# Patient Record
Sex: Female | Born: 1995 | Race: White | Hispanic: No | Marital: Single | Smoking: Current some day smoker
Health system: Southern US, Community
[De-identification: ages and names within clinical notes are randomized; demographics above are authoritative.]

## PROBLEM LIST (undated history)

## (undated) DIAGNOSIS — F32A Depression, unspecified: Secondary | ICD-10-CM

## (undated) DIAGNOSIS — F419 Anxiety disorder, unspecified: Secondary | ICD-10-CM

## (undated) DIAGNOSIS — F329 Major depressive disorder, single episode, unspecified: Secondary | ICD-10-CM

## (undated) DIAGNOSIS — J45909 Unspecified asthma, uncomplicated: Secondary | ICD-10-CM

## (undated) HISTORY — PX: NO PAST SURGERIES: SHX2092

---
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
## 2014-08-11 ENCOUNTER — Encounter (HOSPITAL_COMMUNITY): Payer: Self-pay | Admitting: Emergency Medicine

## 2014-08-11 ENCOUNTER — Other Ambulatory Visit (HOSPITAL_COMMUNITY): Payer: Self-pay

## 2014-08-11 ENCOUNTER — Emergency Department (HOSPITAL_COMMUNITY): Payer: No Typology Code available for payment source

## 2014-08-11 ENCOUNTER — Emergency Department (HOSPITAL_COMMUNITY)
Admission: EM | Admit: 2014-08-11 | Discharge: 2014-08-11 | Disposition: A | Discharge status: Home / Self Care | Payer: No Typology Code available for payment source | Attending: Emergency Medicine | Admitting: Emergency Medicine

## 2014-08-11 DIAGNOSIS — Z3202 Encounter for pregnancy test, result negative: Secondary | ICD-10-CM | POA: Insufficient documentation

## 2014-08-11 DIAGNOSIS — S99929A Unspecified injury of unspecified foot, initial encounter: Secondary | ICD-10-CM

## 2014-08-11 DIAGNOSIS — S99919A Unspecified injury of unspecified ankle, initial encounter: Secondary | ICD-10-CM

## 2014-08-11 DIAGNOSIS — Y9389 Activity, other specified: Secondary | ICD-10-CM | POA: Insufficient documentation

## 2014-08-11 DIAGNOSIS — S199XXA Unspecified injury of neck, initial encounter: Secondary | ICD-10-CM

## 2014-08-11 DIAGNOSIS — S79929A Unspecified injury of unspecified thigh, initial encounter: Secondary | ICD-10-CM | POA: Diagnosis not present

## 2014-08-11 DIAGNOSIS — IMO0002 Reserved for concepts with insufficient information to code with codable children: Secondary | ICD-10-CM | POA: Insufficient documentation

## 2014-08-11 DIAGNOSIS — S8990XA Unspecified injury of unspecified lower leg, initial encounter: Secondary | ICD-10-CM | POA: Insufficient documentation

## 2014-08-11 DIAGNOSIS — S0990XA Unspecified injury of head, initial encounter: Secondary | ICD-10-CM | POA: Diagnosis not present

## 2014-08-11 DIAGNOSIS — S0993XA Unspecified injury of face, initial encounter: Secondary | ICD-10-CM | POA: Insufficient documentation

## 2014-08-11 DIAGNOSIS — J45909 Unspecified asthma, uncomplicated: Secondary | ICD-10-CM | POA: Diagnosis not present

## 2014-08-11 DIAGNOSIS — S79919A Unspecified injury of unspecified hip, initial encounter: Secondary | ICD-10-CM | POA: Diagnosis not present

## 2014-08-11 DIAGNOSIS — Y92410 Unspecified street and highway as the place of occurrence of the external cause: Secondary | ICD-10-CM | POA: Insufficient documentation

## 2014-08-11 HISTORY — DX: Unspecified asthma, uncomplicated: J45.909

## 2014-08-11 LAB — CBC WITH DIFFERENTIAL/PLATELET
BASOS PCT: 1 % (ref 0–1)
Basophils Absolute: 0 10*3/uL (ref 0.0–0.1)
EOS ABS: 0.1 10*3/uL (ref 0.0–0.7)
Eosinophils Relative: 2 % (ref 0–5)
HEMATOCRIT: 41.9 % (ref 36.0–46.0)
HEMOGLOBIN: 14.2 g/dL (ref 12.0–15.0)
Lymphocytes Relative: 36 % (ref 12–46)
Lymphs Abs: 2.3 10*3/uL (ref 0.7–4.0)
MCH: 28.5 pg (ref 26.0–34.0)
MCHC: 33.9 g/dL (ref 30.0–36.0)
MCV: 84 fL (ref 78.0–100.0)
MONO ABS: 0.5 10*3/uL (ref 0.1–1.0)
MONOS PCT: 8 % (ref 3–12)
NEUTROS PCT: 53 % (ref 43–77)
Neutro Abs: 3.3 10*3/uL (ref 1.7–7.7)
Platelets: 208 10*3/uL (ref 150–400)
RBC: 4.99 MIL/uL (ref 3.87–5.11)
RDW: 13.8 % (ref 11.5–15.5)
WBC: 6.3 10*3/uL (ref 4.0–10.5)

## 2014-08-11 LAB — COMPREHENSIVE METABOLIC PANEL
ALBUMIN: 4 g/dL (ref 3.5–5.2)
ALT: 14 U/L (ref 0–35)
ANION GAP: 13 (ref 5–15)
AST: 21 U/L (ref 0–37)
Alkaline Phosphatase: 52 U/L (ref 39–117)
BUN: 7 mg/dL (ref 6–23)
CO2: 24 mEq/L (ref 19–32)
CREATININE: 0.72 mg/dL (ref 0.50–1.10)
Calcium: 9.2 mg/dL (ref 8.4–10.5)
Chloride: 105 mEq/L (ref 96–112)
GFR calc Af Amer: >90 mL/min (ref >90)
GFR calc non Af Amer: >90 mL/min (ref >90)
Glucose, Bld: 89 mg/dL (ref 70–99)
Potassium: 3.8 mEq/L (ref 3.7–5.3)
Sodium: 142 mEq/L (ref 137–147)
TOTAL PROTEIN: 7.3 g/dL (ref 6.0–8.3)
Total Bilirubin: 0.3 mg/dL (ref 0.3–1.2)

## 2014-08-11 LAB — ETHANOL

## 2014-08-11 LAB — RAPID URINE DRUG SCREEN, HOSP PERFORMED
Amphetamines: NOT DETECTED
BENZODIAZEPINES: NOT DETECTED
Barbiturates: NOT DETECTED
COCAINE: NOT DETECTED
OPIATES: NOT DETECTED
Tetrahydrocannabinol: POSITIVE — AB

## 2014-08-11 LAB — POC URINE PREG, ED: PREG TEST UR: NEGATIVE

## 2014-08-11 MED ORDER — SODIUM CHLORIDE 0.9 % IV BOLUS (SEPSIS)
1000.0000 mL | Freq: Once | INTRAVENOUS | Status: AC
  Administered 2014-08-11: 1000 mL via INTRAVENOUS

## 2014-08-11 MED ORDER — HYDROCODONE-ACETAMINOPHEN 5-325 MG PO TABS: 1.0000 | ORAL_TABLET | Freq: Four times a day (QID) | ORAL | Status: DC | PRN

## 2014-08-11 MED ORDER — CYCLOBENZAPRINE HCL 10 MG PO TABS: 10.0000 mg | ORAL_TABLET | Freq: Two times a day (BID) | ORAL | Status: DC | PRN

## 2014-08-11 MED ORDER — HYDROCODONE-ACETAMINOPHEN 5-325 MG PO TABS
2.0000 | ORAL_TABLET | Freq: Once | ORAL | Status: AC
  Administered 2014-08-11: 2 via ORAL
  Filled 2014-08-11: qty 2

## 2014-08-11 MED ORDER — IBUPROFEN 800 MG PO TABS: 800.0000 mg | ORAL_TABLET | Freq: Three times a day (TID) | ORAL | Status: DC

## 2014-08-11 MED ORDER — IOHEXOL 300 MG/ML  SOLN
80.0000 mL | Freq: Once | INTRAMUSCULAR | Status: AC | PRN
  Administered 2014-08-11: 80 mL via INTRAVENOUS

## 2014-08-11 NOTE — ED Provider Notes (Signed)
Medical screening examination/treatment/procedure(s) were performed by non-physician practitioner and as supervising physician I was immediately available for consultation/collaboration.   EKG Interpretation None        Gilda Crease, MD 08/11/14 [phone]

## 2014-08-11 NOTE — ED Notes (Signed)
Pt ambulated in hallway with some pain to R leg.
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
## 2014-08-11 NOTE — ED Notes (Signed)
Marissa,PA at the bedside.  

## 2014-08-11 NOTE — ED Notes (Signed)
State trooper at bedside
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
## 2014-08-12 NOTE — ED Provider Notes (Signed)
Medical screening examination/treatment/procedure(s) were performed by non-physician practitioner and as supervising physician I was immediately available for consultation/collaboration.   EKG Interpretation None        Vanetta Mulders, MD 08/12/14 1125

## 2016-09-27 ENCOUNTER — Emergency Department (HOSPITAL_BASED_OUTPATIENT_CLINIC_OR_DEPARTMENT_OTHER)
Admission: EM | Admit: 2016-09-27 | Discharge: 2016-09-27 | Disposition: A | Discharge status: Home / Self Care | Payer: Medicaid Other | Attending: Emergency Medicine | Admitting: Emergency Medicine

## 2016-09-27 ENCOUNTER — Encounter (HOSPITAL_BASED_OUTPATIENT_CLINIC_OR_DEPARTMENT_OTHER): Payer: Self-pay

## 2016-09-27 DIAGNOSIS — N898 Other specified noninflammatory disorders of vagina: Secondary | ICD-10-CM

## 2016-09-27 DIAGNOSIS — J45909 Unspecified asthma, uncomplicated: Secondary | ICD-10-CM | POA: Insufficient documentation

## 2016-09-27 LAB — WET PREP, GENITAL
Clue Cells Wet Prep HPF POC: NONE SEEN
Sperm: NONE SEEN
Trich, Wet Prep: NONE SEEN
Yeast Wet Prep HPF POC: NONE SEEN

## 2016-09-27 LAB — URINALYSIS, ROUTINE W REFLEX MICROSCOPIC
Bilirubin Urine: NEGATIVE
GLUCOSE, UA: NEGATIVE mg/dL
Hgb urine dipstick: NEGATIVE
Ketones, ur: NEGATIVE mg/dL
Nitrite: NEGATIVE
PROTEIN: NEGATIVE mg/dL
Specific Gravity, Urine: 1.017 (ref 1.005–1.030)
pH: 6 (ref 5.0–8.0)

## 2016-09-27 LAB — URINE MICROSCOPIC-ADD ON

## 2016-09-27 LAB — PREGNANCY, URINE: PREG TEST UR: NEGATIVE

## 2016-09-27 MED ORDER — AZITHROMYCIN 250 MG PO TABS
1000.0000 mg | ORAL_TABLET | Freq: Once | ORAL | Status: AC
  Administered 2016-09-27: 1000 mg via ORAL
  Filled 2016-09-27: qty 4

## 2016-09-27 MED ORDER — DOXYCYCLINE HYCLATE 100 MG PO CAPS: 100.0000 mg | ORAL_CAPSULE | Freq: Two times a day (BID) | ORAL | 0 refills | Status: DC

## 2016-09-27 MED ORDER — CEFTRIAXONE SODIUM 250 MG IJ SOLR
250.0000 mg | Freq: Once | INTRAMUSCULAR | Status: AC
  Administered 2016-09-27: 250 mg via INTRAMUSCULAR
  Filled 2016-09-27: qty 250

## 2016-09-27 MED ORDER — LIDOCAINE HCL (PF) 1 % IJ SOLN
INTRAMUSCULAR | Status: AC
Start: 2016-09-27 — End: 2016-09-27
  Administered 2016-09-27: 5 mL
  Filled 2016-09-27: qty 5

## 2016-09-27 NOTE — Discharge Instructions (Signed)
Take the prescribed medication as directed. Follow-up with your OB-GYN if you continue having issues. Return to the ED for new or worsening symptoms.

## 2016-09-27 NOTE — ED Provider Notes (Signed)
[facility] Note   CSN: [id number] Arrival date & time: 09/27/16  2105  By signing my name below, I, Linna Darner, attest that this documentation has been prepared under the direction and in the presence of Sharilyn Sites, PA-C. Electronically Signed: Linna Darner, Scribe. 09/27/2016. 10:11 PM.  History   Chief Complaint Chief Complaint  Patient presents with  . Dysuria    The history is provided by the patient. No language interpreter was used.     HPI Comments: Paige Sims is a 20 y.o. female who presents to the Emergency Department complaining of sudden onset, intermittent, abdominal pain for the last two weeks.  She states her pain presents randomly throughout her abdomen, varying from day to day. Pt endorses associated bilateral flank pain, brown vaginal discharge, nausea, and pressure with urination. She states she has experienced dysuria but this has resolved. Pt reports she has been eating normally since onset. She notes she had an IUD (Mirena) placed about 3 years ago. She reports her periods are usually irregular. Pt denies h/o abdominal surgery. She denies recent new sexual partners in the past 3 months. She further denies vomiting, frequency, urgency, or any other associated symptoms.  Past Medical History:  Diagnosis Date  . Asthma     There are no active problems to display for this patient.   History reviewed. No pertinent surgical history.  OB History    No data available       Home Medications    Prior to Admission medications   Medication Sig Start Date End Date Taking? Authorizing Provider  adapalene (DIFFERIN) 0.1 % cream Apply 1 application topically daily. As directed 07/29/14   Historical Provider, MD  cyclobenzaprine (FLEXERIL) 10 MG tablet Take 1 tablet (10 mg total) by mouth 2 (two) times daily as needed for muscle spasms. 08/11/14   Roxy Horseman, PA-C  HYDROcodone-acetaminophen (NORCO/VICODIN) 5-325 MG per tablet Take  1-2 tablets by mouth every 6 (six) hours as needed. 08/11/14   Roxy Horseman, PA-C  ibuprofen (ADVIL,MOTRIN) 800 MG tablet Take 1 tablet (800 mg total) by mouth 3 (three) times daily. 08/11/14   Roxy Horseman, PA-C  triamcinolone ointment (KENALOG) 0.1 % Apply 1 application topically daily. As directed 07/29/14   Historical Provider, MD    Family History No family history on file.  Social History Social History  Substance Use Topics  . Smoking status: Never Smoker  . Smokeless tobacco: Not on file  . Alcohol use Yes     Comment: occassional     Allergies   Review of patient's allergies indicates no known allergies.   Review of Systems Review of Systems  Gastrointestinal: Positive for abdominal pain and nausea. Negative for vomiting.  Genitourinary: Positive for dysuria (resolved), flank pain (bilateral) and vaginal discharge. Negative for frequency and urgency.  All other systems reviewed and are negative.   Physical Exam Updated Vital Signs BP 118/82 (BP Location: Right Arm)   Pulse 74   Temp 98.3 F (36.8 C) (Oral)   Resp 18   Ht 5\' 4"  (1.626 m)   Wt 115 lb (52.2 kg)   LMP 08/28/2016 (Approximate) Comment: IUD  SpO2 99%   BMI 19.74 kg/m   Physical Exam  Constitutional: She is oriented to person, place, and time. She appears well-developed and well-nourished.  HENT:  Head: Normocephalic and atraumatic.  Mouth/Throat: Oropharynx is clear and moist.  Eyes: Conjunctivae and EOM are normal. Pupils are equal, round, and reactive to light.  Neck: Normal  range of motion.  Cardiovascular: Normal rate, regular rhythm and normal heart sounds.   Pulmonary/Chest: Effort normal and breath sounds normal.  Abdominal: Soft. Bowel sounds are normal. There is no tenderness. There is no rigidity and no guarding.  Abdomen soft, nontender, no peritonitis, no CVA tenderness  Genitourinary:  Genitourinary Comments: Normal female external genitalia without visible lesions or rash,  moderate amount of yellow/clear mucous noted surrounding cervix and in vaginal vault; IUD strings visible; no bleeding noted; no adnexal or CMT; no masses palpated  Musculoskeletal: Normal range of motion.  Neurological: She is alert and oriented to person, place, and time.  Skin: Skin is warm and dry.  Psychiatric: She has a normal mood and affect.  Nursing note and vitals reviewed.   ED Treatments / Results  Labs (all labs ordered are listed, but only abnormal results are displayed) Labs Reviewed  WET PREP, GENITAL - Abnormal; Notable for the following:       Result Value   WBC, Wet Prep HPF POC MANY (*)    All other components within normal limits  URINALYSIS, ROUTINE W REFLEX MICROSCOPIC (NOT AT [facility]) - Abnormal; Notable for the following:    Leukocytes, UA MODERATE (*)    All other components within normal limits  URINE MICROSCOPIC-ADD ON - Abnormal; Notable for the following:    Squamous Epithelial / LPF 0-5 (*)    Bacteria, UA FEW (*)    All other components within normal limits  PREGNANCY, URINE  GC/CHLAMYDIA PROBE AMP ([place]) NOT AT [facility]    EKG  EKG Interpretation None       Radiology No results found.  Procedures Procedures (including critical care time)  DIAGNOSTIC STUDIES: Oxygen Saturation is 99% on RA, normal by my interpretation.    COORDINATION OF CARE: 10:17 PM Discussed treatment plan with pt at bedside and pt agreed to plan.  Medications Ordered in ED Medications - No data to display   Initial Impression / Assessment and Plan / ED Course  I have reviewed the triage vital signs and the nursing notes.  Pertinent labs & imaging results that were available during my care of the patient were reviewed by me and considered in my medical decision making (see chart for details).  Clinical Course   20 year old female here with abdominal pain. She also reports some vaginal discharge and pressure when urinating.  She is afebrile and nontoxic.   Abdomen is soft and benign. No CVA tenderness. Urine pregnancy test is negative. UA with leukocytes noted, no blood.  Pelvic exam with some yellow/clear mucous noted surrounding the cervix as well as vaginal vault. Her IUD strings are visible. No bleeding. No adnexal or cervical motion tenderness. Many WBCs noted on wet prep. Gc/chl pending.  Given her symptoms, concern for potential STD.  Given her IUD, patient is high risk for developing PID.  Will treat with rocephin and azithromycin here.  D/c home with doxycycline.  Encouraged to follow-up with her GYN.  Discussed plan with patient, she acknowledged understanding and agreed with plan of care.  Return precautions given for new or worsening symptoms.  I personally performed the services described in this documentation, which was scribed in my presence. The recorded information has been reviewed and is accurate.  Final Clinical Impressions(s) / ED Diagnoses   Final diagnoses:  Vaginal discharge    New Prescriptions New Prescriptions   DOXYCYCLINE (VIBRAMYCIN) 100 MG CAPSULE    Take 1 capsule (100 mg total) by mouth 2 (two) times  daily.     Garlon Hatchet, PA-C 09/27/16 2320    Lavera Guise, MD 09/28/16 [phone]

## 2016-09-27 NOTE — ED Triage Notes (Signed)
Pt c/o lower abdominal pain with urinary pressure and lower back pain, no fevers, c/o dark vaginal discharge, hematuria and nausea, symptoms worsening over the last week.

## 2016-09-29 LAB — GC/CHLAMYDIA PROBE AMP (~~LOC~~) NOT AT ARMC
Chlamydia: POSITIVE — AB
Neisseria Gonorrhea: NEGATIVE

## 2016-09-30 ENCOUNTER — Telehealth (HOSPITAL_COMMUNITY): Payer: Self-pay

## 2016-09-30 NOTE — Telephone Encounter (Signed)
[facility] received [facility].  (+) Chlamydia Pt treated with Zithromax and Rocephin.  [facility] form completed and faxed.      09/30/2016 @ 0905  VM not set up.  [facility]

## 2016-10-26 ENCOUNTER — Telehealth (HOSPITAL_BASED_OUTPATIENT_CLINIC_OR_DEPARTMENT_OTHER): Payer: Self-pay | Admitting: Emergency Medicine

## 2016-10-26 NOTE — Telephone Encounter (Signed)
No response to letter, LOST TO FOLLOWUP 

## 2017-03-08 ENCOUNTER — Emergency Department (HOSPITAL_BASED_OUTPATIENT_CLINIC_OR_DEPARTMENT_OTHER): Payer: Self-pay

## 2017-03-08 ENCOUNTER — Observation Stay (HOSPITAL_BASED_OUTPATIENT_CLINIC_OR_DEPARTMENT_OTHER)
Admission: EM | Admit: 2017-03-08 | Discharge: 2017-03-09 | Disposition: A | Discharge status: Home / Self Care | Payer: Self-pay | Attending: Internal Medicine | Admitting: Internal Medicine

## 2017-03-08 ENCOUNTER — Encounter (HOSPITAL_BASED_OUTPATIENT_CLINIC_OR_DEPARTMENT_OTHER): Payer: Self-pay

## 2017-03-08 DIAGNOSIS — Z792 Long term (current) use of antibiotics: Secondary | ICD-10-CM | POA: Insufficient documentation

## 2017-03-08 DIAGNOSIS — J029 Acute pharyngitis, unspecified: Secondary | ICD-10-CM | POA: Insufficient documentation

## 2017-03-08 DIAGNOSIS — J45909 Unspecified asthma, uncomplicated: Secondary | ICD-10-CM | POA: Insufficient documentation

## 2017-03-08 DIAGNOSIS — J982 Interstitial emphysema: Principal | ICD-10-CM | POA: Diagnosis present

## 2017-03-08 HISTORY — DX: Depression, unspecified: F32.A

## 2017-03-08 HISTORY — DX: Major depressive disorder, single episode, unspecified: F32.9

## 2017-03-08 HISTORY — DX: Anxiety disorder, unspecified: F41.9

## 2017-03-08 LAB — CBC WITH DIFFERENTIAL/PLATELET
Basophils Absolute: 0 10*3/uL (ref 0.0–0.1)
Basophils Relative: 0 %
Eosinophils Absolute: 0.2 10*3/uL (ref 0.0–0.7)
Eosinophils Relative: 3 %
HCT: 44.4 % (ref 36.0–46.0)
HEMOGLOBIN: 15.5 g/dL — AB (ref 12.0–15.0)
LYMPHS ABS: 2 10*3/uL (ref 0.7–4.0)
Lymphocytes Relative: 38 %
MCH: 31.3 pg (ref 26.0–34.0)
MCHC: 34.9 g/dL (ref 30.0–36.0)
MCV: 89.7 fL (ref 78.0–100.0)
Monocytes Absolute: 0.4 10*3/uL (ref 0.1–1.0)
Monocytes Relative: 8 %
NEUTROS PCT: 51 %
Neutro Abs: 2.7 10*3/uL (ref 1.7–7.7)
Platelets: 193 10*3/uL (ref 150–400)
RBC: 4.95 MIL/uL (ref 3.87–5.11)
RDW: 12.3 % (ref 11.5–15.5)
WBC: 5.3 10*3/uL (ref 4.0–10.5)

## 2017-03-08 LAB — COMPREHENSIVE METABOLIC PANEL
ALK PHOS: 40 U/L (ref 38–126)
ALT: 14 U/L (ref 14–54)
AST: 21 U/L (ref 15–41)
Albumin: 4.8 g/dL (ref 3.5–5.0)
Anion gap: 9 (ref 5–15)
BILIRUBIN TOTAL: 0.4 mg/dL (ref 0.3–1.2)
BUN: 12 mg/dL (ref 6–20)
CHLORIDE: 99 mmol/L — AB (ref 101–111)
CO2: 31 mmol/L (ref 22–32)
Calcium: 10.1 mg/dL (ref 8.9–10.3)
Creatinine, Ser: 0.83 mg/dL (ref 0.44–1.00)
GFR calc non Af Amer: >60 mL/min (ref >60)
Glucose, Bld: 98 mg/dL (ref 65–99)
Potassium: 3.5 mmol/L (ref 3.5–5.1)
Sodium: 139 mmol/L (ref 135–145)
Total Protein: 8.2 g/dL — ABNORMAL HIGH (ref 6.5–8.1)

## 2017-03-08 LAB — PREGNANCY, URINE: Preg Test, Ur: NEGATIVE

## 2017-03-08 MED ORDER — SODIUM CHLORIDE 0.9 % IV BOLUS (SEPSIS)
1000.0000 mL | Freq: Once | INTRAVENOUS | Status: AC
  Administered 2017-03-08: 1000 mL via INTRAVENOUS

## 2017-03-08 MED ORDER — SODIUM CHLORIDE 0.9 % IV SOLN
INTRAVENOUS | Status: DC
  Administered 2017-03-08: via INTRAVENOUS

## 2017-03-08 MED ORDER — IOPAMIDOL (ISOVUE-300) INJECTION 61%
80.0000 mL | Freq: Once | INTRAVENOUS | Status: AC | PRN
  Administered 2017-03-08: 80 mL via INTRAVENOUS

## 2017-03-08 MED ORDER — ONDANSETRON HCL 4 MG/2ML IJ SOLN
4.0000 mg | Freq: Once | INTRAMUSCULAR | Status: AC
  Administered 2017-03-09: 4 mg via INTRAVENOUS
  Filled 2017-03-08: qty 2

## 2017-03-08 MED ORDER — MORPHINE SULFATE (PF) 4 MG/ML IV SOLN
4.0000 mg | Freq: Once | INTRAVENOUS | Status: AC
  Administered 2017-03-09: 4 mg via INTRAVENOUS
  Filled 2017-03-08: qty 1

## 2017-03-08 NOTE — Consult Note (Signed)
I have discussed the patient's clinical presentation over the telephone at length with Dr. Elesa MassedWard and personally reviewed the patient's CT scan.  Her clinical picture is most consistent with primary spontaneous pneumomediastinum.  There are no clinical features nor diagnostic findings to suggest that the patient might have an esophageal perforation.  Under the circumstances I would typically recommend overnight observation and keeping the patient NPO with plans for repeat PA and lateral CXR and gastrograffin upper GI contrast study tomorrow.  If these are negative a liquid diet could probably be started at that time and the patient discharged home if she were otherwise feeling better.  All questions answered.  Purcell Nailslarence H Owen, MD 03/08/2017 11:57 PM

## 2017-03-08 NOTE — ED Triage Notes (Signed)
Pt c/o upper chest and neck "pressure" and "swollen feeling," states it is hard for her to take a deep breath.  Pt denies fevers, no oral swelling in triage

## 2017-03-08 NOTE — ED Notes (Signed)
Contacted CT tech and made them aware this pt needs to be scanned next.

## 2017-03-08 NOTE — ED Provider Notes (Signed)
By signing my name below, I, Valentino Saxon, attest that this documentation has been prepared under the direction and in the presence of Katelynn Heidler N Nilay Mangrum, DO. Electronically Signed: Valentino Saxon, ED Scribe. 03/08/17. 11:16 PM.   TIME SEEN: 11:03 PM  CHIEF COMPLAINT: Sore throat  HPI: HPI Comments: Paige Sims is a 21 y.o. female with PMHx of asthma who presents to the Emergency Department complaining of 8/10, gradual onset, constant, sore throat that occurred yesterday. She reports associated upper chest pain with a pressure-like sensation accompanied by a stabbing feeling. Pt states it feels like "she has to burp, but I can't get it out", similar to a gas-bubble feeling. Pt notes having right-sided neck pain that radiated to her lower back yesterday, but states it subsided this morning. Per pt, she notes having mild swelling to her neck "to the point I couldn't feel anything because it was so swollen." She states her sore throat is exacerbated with eating, drinking fluids, and deep breathing. She reports hearing a "crackling" noise when touching her neck and with swallowing fluids. Pt also reports associated appetite change due to difficulty swallowing any foods or fluids due to pain. No modifying factors noted. Pt denies cough, fever and abdominal pain. She denies having a PCP at this time. No recent vomiting. No recent endoscopy, intubation or other procedures. History of pneumomediastinum. No history of trauma. She does smoke cigarettes and has been using a vape.  ROS: See HPI Constitutional: sore throat, no fever  Eyes: no drainage  ENT: no runny nose   Cardiovascular:  chest pain  Resp: no SOB  GI: no vomiting GU: no dysuria Integumentary: no rash  Allergy: no hives  Musculoskeletal: no leg swelling  Neurological: no slurred speech ROS otherwise negative  PAST MEDICAL HISTORY/PAST SURGICAL HISTORY:  Past Medical History:  Diagnosis Date  . Asthma     MEDICATIONS:  Prior  to Admission medications   Medication Sig Start Date End Date Taking? Authorizing Provider  adapalene (DIFFERIN) 0.1 % cream Apply 1 application topically daily. As directed 07/29/14   Historical Provider, MD  cyclobenzaprine (FLEXERIL) 10 MG tablet Take 1 tablet (10 mg total) by mouth 2 (two) times daily as needed for muscle spasms. 08/11/14   Roxy Horseman, PA-C  doxycycline (VIBRAMYCIN) 100 MG capsule Take 1 capsule (100 mg total) by mouth 2 (two) times daily. 09/27/16   Garlon Hatchet, PA-C  HYDROcodone-acetaminophen (NORCO/VICODIN) 5-325 MG per tablet Take 1-2 tablets by mouth every 6 (six) hours as needed. 08/11/14   Roxy Horseman, PA-C  ibuprofen (ADVIL,MOTRIN) 800 MG tablet Take 1 tablet (800 mg total) by mouth 3 (three) times daily. 08/11/14   Roxy Horseman, PA-C  triamcinolone ointment (KENALOG) 0.1 % Apply 1 application topically daily. As directed 07/29/14   Historical Provider, MD    ALLERGIES:  No Known Allergies  SOCIAL HISTORY:  Social History  Substance Use Topics  . Smoking status: Never Smoker  . Smokeless tobacco: Not on file  . Alcohol use Yes     Comment: occassional    FAMILY HISTORY: No family history on file.  EXAM: BP 113/65 (BP Location: Right Arm)   Pulse 83   Temp 98.3 F (36.8 C) (Oral)   Resp (!) 23   Ht 5\' 3"  (1.6 m)   Wt 115 lb (52.2 kg)   LMP 03/06/2017   SpO2 100%   BMI 20.37 kg/m  CONSTITUTIONAL: Alert and oriented and responds appropriately to questions. Well-appearing; well-nourished HEAD: Normocephalic EYES: Conjunctivae clear,  pupils appear equal, EOMI ENT: normal nose; moist mucous membranes; No pharyngeal erythema or petechiae, no tonsillar hypertrophy or exudate, no uvular deviation, no unilateral swelling, no trismus or drooling, no muffled voice, normal phonation, no stridor, no dental caries present, no drainable dental abscess noted, no Ludwig's angina, tongue sits flat in the bottom of the mouth, no angioedema, no facial  erythema or warmth, no facial swelling; no pain with movement of the neck. NECK: Supple, no meningismus, no nuchal rigidity, no LAD; no significant swelling, trachea is midline, patient does have some crepitus with palpation of the lateral right neck CARD: RRR; S1 and S2 appreciated; no murmurs, no clicks, no rubs, no gallops CHEST:  Chest wall is nontender to palpation.  No crepitus, ecchymosis, erythema, warmth, rash or other lesions present.   RESP: Normal chest excursion without splinting or tachypnea; breath sounds clear and equal bilaterally; no wheezes, no rhonchi, no rales, no hypoxia or respiratory distress, speaking full sentences ABD/GI: Normal bowel sounds; non-distended; soft, non-tender, no rebound, no guarding, no peritoneal signs, no hepatosplenomegaly BACK:  The back appears normal and is non-tender to palpation, there is no CVA tenderness EXT: Normal ROM in all joints; non-tender to palpation; no edema; normal capillary refill; no cyanosis, no calf tenderness or swelling    SKIN: Normal color for age and race; warm; no rash NEURO: Moves all extremities equally PSYCH: The patient's mood and manner are appropriate. Grooming and personal hygiene are appropriate.  MEDICAL DECISION MAKING: Patient here with pneumomediastinum. X-ray obtained in triage showed pneumomediastinum and further evaluation with CT scan was recommended. Labs obtained in triage are unremarkable and urine pregnancy test is negative. CT scan shows pneumomediastinum predominantly around the esophagus without extravasation of oral contrast. Esophageal perforation cannot be excluded. Lungs appear normal. We'll discuss with cardiothoracic surgery on-call but suspect this is spontaneous pneumomediastinum, possibly from smoking.  ED PROGRESS: Discussed with Dr. Cornelius Moraswen with cardiothoracic surgery. We appreciate his help. He has reviewed patient's imaging and agrees that this is likely not esophageal perforation given no oral  contrast or fluid seen in the soft tissues. She is able to swallow here without difficulty. She is hemodynamically stable but is mildly tachypneic and tachycardic intermittently but this could be from pain and anxiety. No significant swelling of her neck on exam and no compromise of her airway. Dr. Cornelius Moraswen recommended medical admission for observation, keeping patient nothing by mouth and obtaining a PA and lateral chest x-ray in the morning. Patient is comfortable with this plan. We'll discuss with hospitalist.    12:26 AM Discussed patient's case with hospitalist, Dr. Katrinka BlazingSmith.  I have recommended admission and patient (and family if present) agree with this plan. Admitting physician will place admission orders.   I reviewed all nursing notes, vitals, pertinent previous records, EKGs, lab and urine results, imaging (as available).   I personally performed the services described in this documentation, which was scribed in my presence. The recorded information has been reviewed and is accurate.     Layla MawKristen N Constantino Starace, DO 03/09/17 [id number]

## 2017-03-08 NOTE — ED Notes (Signed)
Patient transported to CT 

## 2017-03-09 ENCOUNTER — Encounter (HOSPITAL_COMMUNITY): Payer: Self-pay | Admitting: Internal Medicine

## 2017-03-09 ENCOUNTER — Observation Stay (HOSPITAL_COMMUNITY): Payer: Self-pay

## 2017-03-09 DIAGNOSIS — J982 Interstitial emphysema: Secondary | ICD-10-CM | POA: Diagnosis present

## 2017-03-09 LAB — CBC
HCT: 37.5 % (ref 36.0–46.0)
Hemoglobin: 12.5 g/dL (ref 12.0–15.0)
MCH: 30.3 pg (ref 26.0–34.0)
MCHC: 33.3 g/dL (ref 30.0–36.0)
MCV: 90.8 fL (ref 78.0–100.0)
PLATELETS: 147 10*3/uL — AB (ref 150–400)
RBC: 4.13 MIL/uL (ref 3.87–5.11)
RDW: 12.3 % (ref 11.5–15.5)
WBC: 5.4 10*3/uL (ref 4.0–10.5)

## 2017-03-09 LAB — BASIC METABOLIC PANEL
ANION GAP: 9 (ref 5–15)
BUN: 10 mg/dL (ref 6–20)
CALCIUM: 8.8 mg/dL — AB (ref 8.9–10.3)
CO2: 27 mmol/L (ref 22–32)
CREATININE: 0.87 mg/dL (ref 0.44–1.00)
Chloride: 106 mmol/L (ref 101–111)
GLUCOSE: 89 mg/dL (ref 65–99)
Potassium: 3.8 mmol/L (ref 3.5–5.1)
Sodium: 142 mmol/L (ref 135–145)

## 2017-03-09 LAB — HIV ANTIBODY (ROUTINE TESTING W REFLEX): HIV SCREEN 4TH GENERATION: NONREACTIVE

## 2017-03-09 MED ORDER — ONDANSETRON HCL 4 MG/2ML IJ SOLN
4.0000 mg | Freq: Four times a day (QID) | INTRAMUSCULAR | Status: DC | PRN
  Administered 2017-03-09: 4 mg via INTRAVENOUS
  Filled 2017-03-09: qty 2

## 2017-03-09 MED ORDER — IOPAMIDOL (ISOVUE-300) INJECTION 61%
300.0000 mL | Freq: Once | INTRAVENOUS | Status: AC | PRN
  Administered 2017-03-09: 25 mL via ORAL

## 2017-03-09 MED ORDER — ACETAMINOPHEN 325 MG PO TABS
650.0000 mg | ORAL_TABLET | Freq: Four times a day (QID) | ORAL | Status: DC | PRN
  Filled 2017-03-09: qty 2

## 2017-03-09 MED ORDER — ONDANSETRON HCL 4 MG PO TABS: 4.0000 mg | ORAL_TABLET | Freq: Four times a day (QID) | ORAL | Status: DC | PRN

## 2017-03-09 MED ORDER — IOPAMIDOL (ISOVUE-300) INJECTION 61%
INTRAVENOUS | Status: AC
  Administered 2017-03-09: 25 mL via ORAL
  Filled 2017-03-09: qty 300

## 2017-03-09 MED ORDER — MORPHINE SULFATE (PF) 2 MG/ML IV SOLN
2.0000 mg | INTRAVENOUS | Status: DC | PRN
  Administered 2017-03-09: 2 mg via INTRAVENOUS
  Filled 2017-03-09: qty 1

## 2017-03-09 MED ORDER — ALBUTEROL SULFATE (2.5 MG/3ML) 0.083% IN NEBU: 2.5000 mg | INHALATION_SOLUTION | RESPIRATORY_TRACT | Status: DC | PRN

## 2017-03-09 MED ORDER — ACETAMINOPHEN 650 MG RE SUPP: 650.0000 mg | Freq: Four times a day (QID) | RECTAL | Status: DC | PRN

## 2017-03-09 MED ORDER — ENOXAPARIN SODIUM 30 MG/0.3ML ~~LOC~~ SOLN: 30.0000 mg | SUBCUTANEOUS | Status: DC

## 2017-03-09 NOTE — H&P (Signed)
History and Physical    Paige Sims ZOX:[id number] DOB: November 18, 1996 DOA: 03/08/2017  Referring MD/NP/PA: Dr. Elesa Massed PCP: No PCP Per Patient  Patient coming from: [facility] transfer  Chief Complaint: Sore throat  HPI: Paige Sims is a 21 y.o. female without significant past medical history, who presents with a 2 day history of sore throat. Patient reports constant throat discomfort that is sharp in nature. Rates pain as 8 out of 10 on a pain scale. Initially patient thought symptoms were secondary to indigestion, but when they persisted became more concerned. Tried resting without relief in symptoms. Associated symptoms included neck swelling, chest pressure, difficulty swallowing, decreased appetite, and right-sided neck crepitus. Symptoms seem to worsen with any deep breathing, eating, drinking fluids, or coughing.  Denies any recent trauma, fever, chills, headache, nausea, vomiting, diarrhea, cough, or recent sick contacts.   ED Course: Upon admission to the emergency department patient was found to have acute pneumomediastinum on CT scan.  vital signs were otherwise noted to be stable. Dr. Cornelius Moras of CT surgery was consulted, but recommended observation with repeat AP and lateral Chest x-ray along with upper GI series with Gastrografin in a.m.  Review of Systems: As per HPI otherwise 10 point review of systems negative.   Past Medical History:  Diagnosis Date  . Asthma     History reviewed. No pertinent surgical history.   reports that she has been smoking E-cigarettes.  She does not have any smokeless tobacco history on file. She reports that she drinks alcohol. She reports that she does not use drugs.  No Known Allergies  No family history on file.  Prior to Admission medications   Medication Sig Start Date End Date Taking? Authorizing Provider  adapalene (DIFFERIN) 0.1 % cream Apply 1 application topically daily. As directed 07/29/14   Historical Provider, MD  cyclobenzaprine (FLEXERIL)  10 MG tablet Take 1 tablet (10 mg total) by mouth 2 (two) times daily as needed for muscle spasms. 08/11/14   Roxy Horseman, PA-C  doxycycline (VIBRAMYCIN) 100 MG capsule Take 1 capsule (100 mg total) by mouth 2 (two) times daily. 09/27/16   Garlon Hatchet, PA-C  HYDROcodone-acetaminophen (NORCO/VICODIN) 5-325 MG per tablet Take 1-2 tablets by mouth every 6 (six) hours as needed. 08/11/14   Roxy Horseman, PA-C  ibuprofen (ADVIL,MOTRIN) 800 MG tablet Take 1 tablet (800 mg total) by mouth 3 (three) times daily. 08/11/14   Roxy Horseman, PA-C  triamcinolone ointment (KENALOG) 0.1 % Apply 1 application topically daily. As directed 07/29/14   Historical Provider, MD    Physical Exam:    Constitutional: Young female in NAD, calm, comfortable Vitals:   03/09/17 0030 03/09/17 0100 03/09/17 0131 03/09/17 0228  BP: 115/80 (!) 107/54 98/61 110/64  Pulse: 92 77 76 68  Resp: (!) 27 19 11 12   Temp:   98.2 F (36.8 C) 98.4 F (36.9 C)  TempSrc:   Oral Oral  SpO2: 99% 98% 98% 99%  Weight:    48 kg (105 lb 12.8 oz)  Height:    5\' 3"  (1.6 m)   Eyes: PERRL, lids and conjunctivae normal ENMT: Mucous membranes are dry. Posterior pharynx clear of any exudate or lesions. Normal dentition.  Neck: normal, supple, no masses, no thyromegaly. Tenderness to palpation on the right side of neck and no significant crepitus appreciated at this time.  Respiratory: clear to auscultation bilaterally, no wheezing, no crackles. Normal respiratory effort. No accessory muscle use. Able to talk in full complete sentences. Cardiovascular: Regular rate  and rhythm, no murmurs / rubs / gallops. No extremity edema. 2+ pedal pulses. No carotid bruits.  Abdomen: no tenderness, no masses palpated. No hepatosplenomegaly. Bowel sounds positive.  Musculoskeletal: no clubbing / cyanosis. No joint deformity upper and lower extremities. Good ROM, no contractures. Normal muscle tone.  Skin: no rashes, lesions, ulcers. No  induration Neurologic: CN 2-12 grossly intact. Sensation intact, DTR normal. Strength 5/5 in all 4.  Psychiatric: Normal judgment and insight. Alert and oriented x 3. Normal mood.     Labs on Admission: I have personally reviewed following labs and imaging studies  CBC:  Recent Labs Lab 03/08/17 2134  WBC 5.3  NEUTROABS 2.7  HGB 15.5*  HCT 44.4  MCV 89.7  PLT 193   Basic Metabolic Panel:  Recent Labs Lab 03/08/17 2134  NA 139  K 3.5  CL 99*  CO2 31  GLUCOSE 98  BUN 12  CREATININE 0.83  CALCIUM 10.1   GFR: Estimated Creatinine Clearance: 80.6 mL/min (by C-G formula based on SCr of 0.83 mg/dL). Liver Function Tests:  Recent Labs Lab 03/08/17 2134  AST 21  ALT 14  ALKPHOS 40  BILITOT 0.4  PROT 8.2*  ALBUMIN 4.8   No results for input(s): LIPASE, AMYLASE in the last 168 hours. No results for input(s): AMMONIA in the last 168 hours. Coagulation Profile: No results for input(s): INR, PROTIME in the last 168 hours. Cardiac Enzymes: No results for input(s): CKTOTAL, CKMB, CKMBINDEX, TROPONINI in the last 168 hours. BNP (last 3 results) No results for input(s): PROBNP in the last 8760 hours. HbA1C: No results for input(s): HGBA1C in the last 72 hours. CBG: No results for input(s): GLUCAP in the last 168 hours. Lipid Profile: No results for input(s): CHOL, HDL, LDLCALC, TRIG, CHOLHDL, LDLDIRECT in the last 72 hours. Thyroid Function Tests: No results for input(s): TSH, T4TOTAL, FREET4, T3FREE, THYROIDAB in the last 72 hours. Anemia Panel: No results for input(s): VITAMINB12, FOLATE, FERRITIN, TIBC, IRON, RETICCTPCT in the last 72 hours. Urine analysis:    Component Value Date/Time   COLORURINE YELLOW 09/27/2016 2110   APPEARANCEUR CLEAR 09/27/2016 2110   LABSPEC 1.017 09/27/2016 2110   PHURINE 6.0 09/27/2016 2110   GLUCOSEU NEGATIVE 09/27/2016 2110   HGBUR NEGATIVE 09/27/2016 2110   BILIRUBINUR NEGATIVE 09/27/2016 2110   KETONESUR NEGATIVE 09/27/2016  2110   PROTEINUR NEGATIVE 09/27/2016 2110   NITRITE NEGATIVE 09/27/2016 2110   LEUKOCYTESUR MODERATE (A) 09/27/2016 2110   Sepsis Labs: No results found for this or any previous visit (from the past 240 hour(s)).   Radiological Exams on Admission: Dg Neck Soft Tissue  Result Date: 03/08/2017 CLINICAL DATA:  Acute onset of throat pain and sensation of weight on chest. Initial encounter. EXAM: NECK SOFT TISSUES - 1+ VIEW COMPARISON:  Cervical spine CT performed 08/11/2014 FINDINGS: There appears to be diffuse prevertebral soft tissue air, tracking inferiorly posterior to the trachea and about the lower neck. On correlation with concurrent chest radiograph, this could reflect superior extension of pneumomediastinum, though perforation along the oropharynx or hypopharynx cannot be entirely excluded. The epiglottis is normal in thickness. The visualized osseous structures are grossly unremarkable. The visualized lung apices are clear. IMPRESSION: Diffuse prevertebral soft tissue air noted, tracking inferiorly posterior to the trachea and about the lower neck. On correlation with concurrent chest radiograph, this could reflect superior extension of pneumomediastinum, though perforation along the oropharynx or hypopharynx cannot be entirely excluded. CT of the neck and chest with IV contrast is recommended for  further evaluation, with oral administration of water-soluble contrast during the study to assess the oropharynx, hypopharynx and esophagus. Electronically Signed   By: Roanna RaiderJeffery  Chang M.D.   On: 03/08/2017 21:32   Dg Chest 2 View  Result Date: 03/08/2017 CLINICAL DATA:  Acute onset of throat swelling and chest pressure. Initial encounter. EXAM: CHEST  2 VIEW COMPARISON:  CT of the chest performed 08/11/2014 FINDINGS: The lungs are well-aerated. There is no evidence of focal opacification, pleural effusion or pneumothorax. The heart is normal in size; the mediastinal contour is within normal limits.  There is suggestion of mild left-sided pneumomediastinum. No acute osseous abnormalities are seen. Bilateral metallic nipple piercings are seen. IMPRESSION: 1. Suggestion of mild left-sided pneumomediastinum. 2. Lungs remain grossly clear. These results (and the results from the concurrent radiographs of the neck) were called by telephone at the time of interpretation on 03/08/2017 at 9:29 pm to Dr. Bary CastillaOURTENEY MACKUEN, who verbally acknowledged these results. Electronically Signed   By: Roanna RaiderJeffery  Chang M.D.   On: 03/08/2017 21:33   Ct Soft Tissue Neck W Contrast  Addendum Date: 03/08/2017   ADDENDUM REPORT: 03/08/2017 22:47 CONTRAST:  80 cc Isovue-300 Electronically Signed   By: Awilda Metroourtnay  Bloomer M.D.   On: 03/08/2017 22:47   Result Date: 03/08/2017 CLINICAL DATA:  Acute onset throat swelling and chest pressure without trauma. Follow-up subcutaneous gas and pneumomediastinum. EXAM: CT OF THE NECK WITH CONTRAST CT OF THE CHEST WITH CONTRAST TECHNIQUE: Multidetector CT imaging of the neck was performed with intravenous contrast. Multidetector CT imaging of the chest was performed following the standard protocol during bolus administration of intravenous contrast. CONTRAST:  80mL ISOVUE-300 IOPAMIDOL (ISOVUE-300) INJECTION 61% COMPARISON:  None. FINDINGS: CT NECK FINDINGS PHARYNX AND LARYNX: Normal.  Widely patent airway. SALIVARY GLANDS: Normal. THYROID: Normal. LYMPH NODES: No lymphadenopathy by CT size criteria. VASCULAR: Normal. LIMITED INTRACRANIAL: Normal. VISUALIZED ORBITS: Normal. MASTOIDS AND VISUALIZED PARANASAL SINUSES: Subcentimeter RIGHT maxillary mucosal retention cyst. Secretions layering in LEFT sphenoid sinus. Mastoid air cells are well aerated. SKELETON: Nonacute. OTHER: Subcutaneous gas tracking within the bilateral neck and, retropharyngeal space. CT CHEST FINDINGS CARDIOVASCULAR: Heart and pericardium are unremarkable. Thoracic aorta is normal course and caliber, unremarkable. MEDIASTINUM/NODES:  Small amount of pneumomediastinum predominately around the esophagus. No contrast extravasation. No radiopaque foreign bodies or focal fluid collections. Small amount of residual thymic tissue. LUNGS/PLEURA: Tracheobronchial tree is patent, no pneumothorax. No pleural effusions, focal consolidations, pulmonary nodules or masses. UPPER ABDOMEN: Small hiatal hernia. Contrast in the distal esophagus and stomach. MUSCULOSKELETAL: Included soft tissues and included osseous structures are nonacute. Multilevel Schmorl's nodes. IMPRESSION: CT neck: Subcutaneous gas and lateral neck and retro pharyngeal space contiguous with pneumomediastinum. Otherwise negative CT neck. CT chest: Pneumomediastinum, predominately surrounding the esophagus without extravasation of oral contrast, esophageal perforation not excluded. Otherwise negative CT chest. Electronically Signed: By: Awilda Metroourtnay  Bloomer M.D. On: 03/08/2017 22:45   Ct Chest W Contrast  Addendum Date: 03/08/2017   ADDENDUM REPORT: 03/08/2017 22:47 CONTRAST:  80 cc Isovue-300 Electronically Signed   By: Awilda Metroourtnay  Bloomer M.D.   On: 03/08/2017 22:47   Result Date: 03/08/2017 CLINICAL DATA:  Acute onset throat swelling and chest pressure without trauma. Follow-up subcutaneous gas and pneumomediastinum. EXAM: CT OF THE NECK WITH CONTRAST CT OF THE CHEST WITH CONTRAST TECHNIQUE: Multidetector CT imaging of the neck was performed with intravenous contrast. Multidetector CT imaging of the chest was performed following the standard protocol during bolus administration of intravenous contrast. CONTRAST:  80mL ISOVUE-300 IOPAMIDOL (ISOVUE-300)  INJECTION 61% COMPARISON:  None. FINDINGS: CT NECK FINDINGS PHARYNX AND LARYNX: Normal.  Widely patent airway. SALIVARY GLANDS: Normal. THYROID: Normal. LYMPH NODES: No lymphadenopathy by CT size criteria. VASCULAR: Normal. LIMITED INTRACRANIAL: Normal. VISUALIZED ORBITS: Normal. MASTOIDS AND VISUALIZED PARANASAL SINUSES: Subcentimeter RIGHT  maxillary mucosal retention cyst. Secretions layering in LEFT sphenoid sinus. Mastoid air cells are well aerated. SKELETON: Nonacute. OTHER: Subcutaneous gas tracking within the bilateral neck and, retropharyngeal space. CT CHEST FINDINGS CARDIOVASCULAR: Heart and pericardium are unremarkable. Thoracic aorta is normal course and caliber, unremarkable. MEDIASTINUM/NODES: Small amount of pneumomediastinum predominately around the esophagus. No contrast extravasation. No radiopaque foreign bodies or focal fluid collections. Small amount of residual thymic tissue. LUNGS/PLEURA: Tracheobronchial tree is patent, no pneumothorax. No pleural effusions, focal consolidations, pulmonary nodules or masses. UPPER ABDOMEN: Small hiatal hernia. Contrast in the distal esophagus and stomach. MUSCULOSKELETAL: Included soft tissues and included osseous structures are nonacute. Multilevel Schmorl's nodes. IMPRESSION: CT neck: Subcutaneous gas and lateral neck and retro pharyngeal space contiguous with pneumomediastinum. Otherwise negative CT neck. CT chest: Pneumomediastinum, predominately surrounding the esophagus without extravasation of oral contrast, esophageal perforation not excluded. Otherwise negative CT chest. Electronically Signed: By: Awilda Metro M.D. On: 03/08/2017 22:45     Assessment/Plan Pneumomediastinum: Acute. Imaging studies showing pneumomediastinum. There are no clear signs of esophageal perforation. Etiology unclear at this time. - Admit to telemetry bed  - NPO - Recheck chest x-ray AP and lateral in a.m. - Check upper GI series with Gastrografin in a.m. - IV morphine prn neck pain - IVF NS - Appreciate Dr. Cornelius Moras of cardiothoracic surgery consultative services, will follow-up for further recommendations   DVT prophylaxis: lovenox  Code Status: Full Family Communication: Discussed plan of care with the patient and significant other at bedside.   Disposition Plan: possible discharge home in  1-2 days.   Consults called: Cardiothoracic surgery  Admission status: Observation  Clydie Braun MD [facility] Pager [phone]  If 7PM-7AM, please contact night-coverage [email or web address] Password [facility]  03/09/2017, 3:32 AM

## 2017-03-09 NOTE — Progress Notes (Signed)
D/c'd pt Saline lock from left f/a; Tele d/c'd as well. Pt and female family member in room denied any questions about d/c instructions or medications. We discussed for pt to allow herself time to build up strength after being in hospital and not was active as usual. Pt verbalized understanding. Pt to exit via w/c with family members present.

## 2017-03-09 NOTE — Progress Notes (Signed)
Pt seen and examined at bedside this am. Pt admitted after midnight so for details please refer to admission note done 03/08/2017. Pt admitted with pneumomediastinum. Awaiting PA and lateral chest xray and gastrografin upper GI with contrast to be done today. Possible discharge if those studies are normal.  Manson PasseyAlma Sanad Fearnow [facility] Inc[facility] [phone]

## 2017-03-09 NOTE — Discharge Summary (Signed)
Physician Discharge Summary  Paige Sims ZOX:[id number] DOB: [birth year] DOA: 03/08/2017  PCP: No PCP Per Patient  Admit date: 03/08/2017 Discharge date: 03/09/2017  Recommendations for Outpatient Follow-up:  1. Advanced diet slowly   Discharge Diagnoses:  Principal Problem:   Pneumomediastinum (HCC)    Discharge Condition: stable   Diet recommendation: as tolerated   History of present illness:   Per HPI "21 y.o. female without significant past medical history, who presents with a 2 day history of sore throat. Patient reports constant throat discomfort that is sharp in nature. Rates pain as 8 out of 10 on a pain scale. Initially patient thought symptoms were secondary to indigestion, but when they persisted became more concerned. Tried resting without relief in symptoms. Associated symptoms included neck swelling, chest pressure, difficulty swallowing, decreased appetite, and right-sided neck crepitus. Symptoms seem to worsen with any deep breathing, eating, drinking fluids, or coughing.  Denies any recent trauma, fever, chills, headache, nausea, vomiting, diarrhea, cough, or recent sick contacts. "  Hospital Course:  Principal Problem:   Pneumomediastinum (HCC) - Repeat x ray did not show mediastinum - Normal gastrografin - Okay for discharge as CTS noted in their note - She tolerated CLD  - I spoke with pt mother over the phone and gave results of x ray sudies and gastrografin   Signed:  Manson Passey, MD  [facility] 03/09/2017, 2:20 PM  Pager #: [phone]  Time spent in minutes: less than 30 minutes  Procedures:  None   Consultations:  CTS  Discharge Exam: Vitals:   03/09/17 0131 03/09/17 0228  BP: 98/61 110/64  Pulse: 76 68  Resp: 11 12  Temp: 98.2 F (36.8 C) 98.4 F (36.9 C)   Vitals:   03/09/17 0030 03/09/17 0100 03/09/17 0131 03/09/17 0228  BP: 115/80 (!) 107/54 98/61 110/64  Pulse: 92 77 76 68  Resp: (!) 27 19 11 12   Temp:   98.2 F  (36.8 C) 98.4 F (36.9 C)  TempSrc:   Oral Oral  SpO2: 99% 98% 98% 99%  Weight:    48 kg (105 lb 12.8 oz)  Height:    5\' 3"  (1.6 m)    General: Pt is alert, follows commands appropriately, not in acute distress Cardiovascular: Regular rate and rhythm, S1/S2 +, no murmurs Respiratory: Clear to auscultation bilaterally, no wheezing, no crackles, no rhonchi Abdominal: Soft, non tender, non distended, bowel sounds +, no guarding Extremities: no edema, no cyanosis, pulses palpable bilaterally DP and PT Neuro: Grossly nonfocal  Discharge Instructions  Discharge Instructions    Call MD for:  persistant nausea and vomiting    Complete by:  As directed    Call MD for:  redness, tenderness, or signs of infection (pain, swelling, redness, odor or green/yellow discharge around incision site)    Complete by:  As directed    Call MD for:  severe uncontrolled pain    Complete by:  As directed    Diet - low sodium heart healthy    Complete by:  As directed    Discharge instructions    Complete by:  As directed    Advance diet slowly. No air in the mediastinum seen on repeat chest x ray today. Follow up with PCP in 1 week, may need to repeat x ray just to ensure stability.   Increase activity slowly    Complete by:  As directed      Allergies as of 03/09/2017   No Known Allergies  Medication List    STOP taking these medications   cyclobenzaprine 10 MG tablet Commonly known as:  FLEXERIL   doxycycline 100 MG capsule Commonly known as:  VIBRAMYCIN   HYDROcodone-acetaminophen 5-325 MG tablet Commonly known as:  NORCO/VICODIN     TAKE these medications   albuterol 108 (90 Base) MCG/ACT inhaler Commonly known as:  PROVENTIL HFA;VENTOLIN HFA Inhale 3 puffs into the lungs every 4 (four) hours as needed for wheezing or shortness of breath.   ibuprofen 200 MG tablet Commonly known as:  ADVIL,MOTRIN Take 600 mg by mouth every 6 (six) hours as needed for headache or moderate  pain. What changed:  Another medication with the same name was removed. Continue taking this medication, and follow the directions you see here.   Melatonin 5 MG Caps Take 5 mg by mouth at bedtime as needed (for sleep).         The results of significant diagnostics from this hospitalization (including imaging, microbiology, ancillary and laboratory) are listed below for reference.    Significant Diagnostic Studies: Dg Neck Soft Tissue  Result Date: 03/08/2017 CLINICAL DATA:  Acute onset of throat pain and sensation of weight on chest. Initial encounter. EXAM: NECK SOFT TISSUES - 1+ VIEW COMPARISON:  Cervical spine CT performed 08/11/2014 FINDINGS: There appears to be diffuse prevertebral soft tissue air, tracking inferiorly posterior to the trachea and about the lower neck. On correlation with concurrent chest radiograph, this could reflect superior extension of pneumomediastinum, though perforation along the oropharynx or hypopharynx cannot be entirely excluded. The epiglottis is normal in thickness. The visualized osseous structures are grossly unremarkable. The visualized lung apices are clear. IMPRESSION: Diffuse prevertebral soft tissue air noted, tracking inferiorly posterior to the trachea and about the lower neck. On correlation with concurrent chest radiograph, this could reflect superior extension of pneumomediastinum, though perforation along the oropharynx or hypopharynx cannot be entirely excluded. CT of the neck and chest with IV contrast is recommended for further evaluation, with oral administration of water-soluble contrast during the study to assess the oropharynx, hypopharynx and esophagus. Electronically Signed   By: Roanna RaiderJeffery  Chang M.D.   On: 03/08/2017 21:32   Dg Chest 2 View  Result Date: 03/09/2017 CLINICAL DATA:  Sudden onset throat pain yesterday. Pneumomediastinum. EXAM: CHEST  2 VIEW COMPARISON:  03/08/2017 FINDINGS: Pneumomediastinum it was previously noted on plain  film and CT not appreciated on today's study. No pneumothorax. Lungs are clear. Heart is normal size. IMPRESSION: No acute findings. Previously-seen pneumomediastinum cannot be visualized on today's study. Electronically Signed   By: Charlett NoseKevin  Dover M.D.   On: 03/09/2017 09:05   Dg Chest 2 View  Result Date: 03/08/2017 CLINICAL DATA:  Acute onset of throat swelling and chest pressure. Initial encounter. EXAM: CHEST  2 VIEW COMPARISON:  CT of the chest performed 08/11/2014 FINDINGS: The lungs are well-aerated. There is no evidence of focal opacification, pleural effusion or pneumothorax. The heart is normal in size; the mediastinal contour is within normal limits. There is suggestion of mild left-sided pneumomediastinum. No acute osseous abnormalities are seen. Bilateral metallic nipple piercings are seen. IMPRESSION: 1. Suggestion of mild left-sided pneumomediastinum. 2. Lungs remain grossly clear. These results (and the results from the concurrent radiographs of the neck) were called by telephone at the time of interpretation on 03/08/2017 at 9:29 pm to Dr. Bary CastillaOURTENEY MACKUEN, who verbally acknowledged these results. Electronically Signed   By: Roanna RaiderJeffery  Chang M.D.   On: 03/08/2017 21:33   Ct Soft Tissue Neck  W Contrast  Addendum Date: 03/08/2017   ADDENDUM REPORT: 03/08/2017 22:47 CONTRAST:  80 cc Isovue-300 Electronically Signed   By: Awilda Metro M.D.   On: 03/08/2017 22:47   Result Date: 03/08/2017 CLINICAL DATA:  Acute onset throat swelling and chest pressure without trauma. Follow-up subcutaneous gas and pneumomediastinum. EXAM: CT OF THE NECK WITH CONTRAST CT OF THE CHEST WITH CONTRAST TECHNIQUE: Multidetector CT imaging of the neck was performed with intravenous contrast. Multidetector CT imaging of the chest was performed following the standard protocol during bolus administration of intravenous contrast. CONTRAST:  80mL ISOVUE-300 IOPAMIDOL (ISOVUE-300) INJECTION 61% COMPARISON:  None. FINDINGS:  CT NECK FINDINGS PHARYNX AND LARYNX: Normal.  Widely patent airway. SALIVARY GLANDS: Normal. THYROID: Normal. LYMPH NODES: No lymphadenopathy by CT size criteria. VASCULAR: Normal. LIMITED INTRACRANIAL: Normal. VISUALIZED ORBITS: Normal. MASTOIDS AND VISUALIZED PARANASAL SINUSES: Subcentimeter RIGHT maxillary mucosal retention cyst. Secretions layering in LEFT sphenoid sinus. Mastoid air cells are well aerated. SKELETON: Nonacute. OTHER: Subcutaneous gas tracking within the bilateral neck and, retropharyngeal space. CT CHEST FINDINGS CARDIOVASCULAR: Heart and pericardium are unremarkable. Thoracic aorta is normal course and caliber, unremarkable. MEDIASTINUM/NODES: Small amount of pneumomediastinum predominately around the esophagus. No contrast extravasation. No radiopaque foreign bodies or focal fluid collections. Small amount of residual thymic tissue. LUNGS/PLEURA: Tracheobronchial tree is patent, no pneumothorax. No pleural effusions, focal consolidations, pulmonary nodules or masses. UPPER ABDOMEN: Small hiatal hernia. Contrast in the distal esophagus and stomach. MUSCULOSKELETAL: Included soft tissues and included osseous structures are nonacute. Multilevel Schmorl's nodes. IMPRESSION: CT neck: Subcutaneous gas and lateral neck and retro pharyngeal space contiguous with pneumomediastinum. Otherwise negative CT neck. CT chest: Pneumomediastinum, predominately surrounding the esophagus without extravasation of oral contrast, esophageal perforation not excluded. Otherwise negative CT chest. Electronically Signed: By: Awilda Metro M.D. On: 03/08/2017 22:45   Ct Chest W Contrast  Addendum Date: 03/08/2017   ADDENDUM REPORT: 03/08/2017 22:47 CONTRAST:  80 cc Isovue-300 Electronically Signed   By: Awilda Metro M.D.   On: 03/08/2017 22:47   Result Date: 03/08/2017 CLINICAL DATA:  Acute onset throat swelling and chest pressure without trauma. Follow-up subcutaneous gas and pneumomediastinum. EXAM: CT  OF THE NECK WITH CONTRAST CT OF THE CHEST WITH CONTRAST TECHNIQUE: Multidetector CT imaging of the neck was performed with intravenous contrast. Multidetector CT imaging of the chest was performed following the standard protocol during bolus administration of intravenous contrast. CONTRAST:  80mL ISOVUE-300 IOPAMIDOL (ISOVUE-300) INJECTION 61% COMPARISON:  None. FINDINGS: CT NECK FINDINGS PHARYNX AND LARYNX: Normal.  Widely patent airway. SALIVARY GLANDS: Normal. THYROID: Normal. LYMPH NODES: No lymphadenopathy by CT size criteria. VASCULAR: Normal. LIMITED INTRACRANIAL: Normal. VISUALIZED ORBITS: Normal. MASTOIDS AND VISUALIZED PARANASAL SINUSES: Subcentimeter RIGHT maxillary mucosal retention cyst. Secretions layering in LEFT sphenoid sinus. Mastoid air cells are well aerated. SKELETON: Nonacute. OTHER: Subcutaneous gas tracking within the bilateral neck and, retropharyngeal space. CT CHEST FINDINGS CARDIOVASCULAR: Heart and pericardium are unremarkable. Thoracic aorta is normal course and caliber, unremarkable. MEDIASTINUM/NODES: Small amount of pneumomediastinum predominately around the esophagus. No contrast extravasation. No radiopaque foreign bodies or focal fluid collections. Small amount of residual thymic tissue. LUNGS/PLEURA: Tracheobronchial tree is patent, no pneumothorax. No pleural effusions, focal consolidations, pulmonary nodules or masses. UPPER ABDOMEN: Small hiatal hernia. Contrast in the distal esophagus and stomach. MUSCULOSKELETAL: Included soft tissues and included osseous structures are nonacute. Multilevel Schmorl's nodes. IMPRESSION: CT neck: Subcutaneous gas and lateral neck and retro pharyngeal space contiguous with pneumomediastinum. Otherwise negative CT neck. CT chest: Pneumomediastinum, predominately surrounding the  esophagus without extravasation of oral contrast, esophageal perforation not excluded. Otherwise negative CT chest. Electronically Signed: By: Awilda Metro M.D. On:  03/08/2017 22:45   Dg Kayleen Memos W/water Sol Cm  Result Date: 03/09/2017 CLINICAL DATA:  Pneumomediastinum on chest CT.  Chest pain. EXAM: UPPER GI SERIES TECHNIQUE: Single-column upper GI series was performed using water soluble contrast and thin barium. CONTRAST:  25 cc of Isovue-300 COMPARISON:  Chest CT 03/08/2017 FLUOROSCOPY TIME:  Fluoroscopy Time:  2 minutes and 6 seconds Number of Acquired Spot Images: 0 FINDINGS: Hypopharyngeal portion of the exam is unremarkable. Focused single-contrast evaluation of the esophagus demonstrates no extravasation to suggest esophageal tear. Normal esophageal motility. Normal single-contrast appearance of the stomach and duodenal C-loop. IMPRESSION: No evidence of contrast extravasation to suggest esophageal or gastric perforation. Electronically Signed   By: Jeronimo Greaves M.D.   On: 03/09/2017 09:34    Microbiology: No results found for this or any previous visit (from the past 240 hour(s)).   Labs: Basic Metabolic Panel:  Recent Labs Lab 03/08/17 2134 03/09/17 0533  NA 139 142  K 3.5 3.8  CL 99* 106  CO2 31 27  GLUCOSE 98 89  BUN 12 10  CREATININE 0.83 0.87  CALCIUM 10.1 8.8*   Liver Function Tests:  Recent Labs Lab 03/08/17 2134  AST 21  ALT 14  ALKPHOS 40  BILITOT 0.4  PROT 8.2*  ALBUMIN 4.8   No results for input(s): LIPASE, AMYLASE in the last 168 hours. No results for input(s): AMMONIA in the last 168 hours. CBC:  Recent Labs Lab 03/08/17 2134 03/09/17 0431  WBC 5.3 5.4  NEUTROABS 2.7  --   HGB 15.5* 12.5  HCT 44.4 37.5  MCV 89.7 90.8  PLT 193 147*   Cardiac Enzymes: No results for input(s): CKTOTAL, CKMB, CKMBINDEX, TROPONINI in the last 168 hours. BNP: BNP (last 3 results) No results for input(s): BNP in the last 8760 hours.  ProBNP (last 3 results) No results for input(s): PROBNP in the last 8760 hours.  CBG: No results for input(s): GLUCAP in the last 168 hours.

## 2017-03-09 NOTE — Progress Notes (Signed)
[facility], patient has been placed on telemetry, vitals taken, patient has no pain complaint, vitals are stable. [facility] has been paged. Patient is in room with family, call bell at bedside. Will continue to monitor.

## 2017-03-09 NOTE — Discharge Instructions (Signed)
Pneumomediastinum Pneumomediastinum is the presence of air in the area between the lungs and behind the breastbone (mediastinum). Mild cases of this condition may not cause problems, but severe cases can interfere with the normal functions of the heart and lungs. What are the causes? This condition happens when air leaks out of the lungs, airways, or intestines into the mediastinum. This condition may be caused by:  Childbirth.  An injury to the chest, lung, intestine, esophagus, or abdomen.  Asthma.  Rapid ascent during scuba diving.  Use of a breathing machine (ventilator).  Inhaling or ingesting certain drugs or chemicals.  An infection in the face, neck, chest, or abdomen.  Extreme strain during coughing or vomiting.  Breathing an object into the airway. This condition can also occur on its own. What are the signs or symptoms? Symptoms of this condition include:  Chest pain. The pain may run into your neck, shoulder, back, or arms.  Increased pain when you move, swallow, or take a deep breath.  Problems swallowing.  Problems speaking.  Changes in your voice.  Shortness of breath.  Fever.  Throat or jaw pain. In some cases, especially in cases where the condition occurred on its own, there are no symptoms. How is this diagnosed? This condition may be diagnosed with:  A description of your symptoms.  A physical exam.  Imaging tests, such as a chest X-ray or CT scan. How is this treated? Treatment depends on how severe the condition is and if there are complications. If your condition is mild, you may not need treatment. Your body may slowly reabsorb the air in your mediastinum. You will stay in the hospital for observation and get medicine for pain if you have pain. More severe cases may involve treating the underlying cause. If the air starts to put pressure on your heart or lungs, you may have one or more of these procedures:  Needle aspiration. In this  procedure, a needle is used to remove trapped air.  Chest tube placement. This may be done if your lung collapses.  Surgery. This may be done to repair a hole in the intestine or esophagus. Follow these instructions at home:  Until your health care provider says it is okay, avoid:  Air travel.  Scuba diving.  High altitudes.  Hard physical work.  Exercise.  Do not use any tobacco products, such as cigarettes, chewing tobacco, and e-cigarettes. If you need help quitting, ask your health care provider.  Do not use illegal drugs.  Take over-the-counter and prescription medicines only as told by your health care provider. Contact a health care provider if:  You have a fever. Get help right away if:  You have worsening pain in your chest, neck, jaw, or arms.  You have trouble breathing.  You have new problems with speaking or swallowing. This information is not intended to replace advice given to you by your health care provider. Make sure you discuss any questions you have with your health care provider. Document Released: 11/11/2008 Document Revised: 12/30/2015 Document Reviewed: 09/26/2015 Elsevier Interactive Patient Education  2017 [employer]

## 2017-03-09 NOTE — Progress Notes (Signed)
[name] is a 21 year old female with pmh of tobacco abuse who presents with complaints of 2 days of difficulty swallowing and neck swelling. Found to have acute pneumomediastinum on CT scan. Dr. Cornelius Moraswen of CT surgery was consulted, but recommended observation with repeat AP and lateral imaging in a.m. Thought less likely to have an acute esophageal perforation. VS: respirations 18-24, O2 saturations maintain on RA, and all other vitals stable. Lab work was otherwise unremarkable. Transferring to a telemetry bed for observation.

## 2017-03-09 NOTE — ED Notes (Signed)
ED Provider at bedside. 

## 2017-05-30 ENCOUNTER — Emergency Department (HOSPITAL_BASED_OUTPATIENT_CLINIC_OR_DEPARTMENT_OTHER): Payer: Medicaid Other

## 2017-05-30 ENCOUNTER — Emergency Department (HOSPITAL_BASED_OUTPATIENT_CLINIC_OR_DEPARTMENT_OTHER)
Admission: EM | Admit: 2017-05-30 | Discharge: 2017-05-30 | Discharge status: Against Medical Advice | Payer: Medicaid Other | Attending: Emergency Medicine | Admitting: Emergency Medicine

## 2017-05-30 ENCOUNTER — Encounter (HOSPITAL_BASED_OUTPATIENT_CLINIC_OR_DEPARTMENT_OTHER): Payer: Self-pay | Admitting: *Deleted

## 2017-05-30 DIAGNOSIS — Z5329 Procedure and treatment not carried out because of patient's decision for other reasons: Secondary | ICD-10-CM

## 2017-05-30 DIAGNOSIS — Z79899 Other long term (current) drug therapy: Secondary | ICD-10-CM | POA: Insufficient documentation

## 2017-05-30 DIAGNOSIS — R0602 Shortness of breath: Secondary | ICD-10-CM | POA: Diagnosis present

## 2017-05-30 DIAGNOSIS — F17290 Nicotine dependence, other tobacco product, uncomplicated: Secondary | ICD-10-CM | POA: Diagnosis not present

## 2017-05-30 DIAGNOSIS — J45909 Unspecified asthma, uncomplicated: Secondary | ICD-10-CM | POA: Insufficient documentation

## 2017-05-30 DIAGNOSIS — Z5320 Procedure and treatment not carried out because of patient's decision for unspecified reasons: Secondary | ICD-10-CM

## 2017-05-30 LAB — PREGNANCY, URINE: PREG TEST UR: NEGATIVE

## 2017-05-30 NOTE — ED Provider Notes (Signed)
[facility] Provider Note   CSN: [id number] Arrival date & time: 05/30/17  1906  By signing my name below, I, Paige Sims, attest that this documentation has been prepared under the direction and in the presence of  Paige Heck, PA-C. Electronically Signed: Doreatha Sims, ED Scribe. 05/30/17. 10:13 PM.    History   Chief Complaint Chief Complaint  Patient presents with  . Shortness of Breath    HPI Paige Sims is a 21 y.o. female with h/o pneumomediastinum 02/2017 who presents to the Emergency Department complaining of constant chest and throat discomfort "like an air bubble" that began last night. She states her pain improves throughout the day and is worst in the morning. Throat pressure has resolved but still has chest discomfort. She reports with prior pneumomediastinum her symptoms were similar. She reports her pain is worsened with deep breathing and states she feels "little bubbles/bubble wrap" with deep breathing, worse with yawning.  Patient had nausea and vomiting (2x / day) with lower abdominal pain last week. She drank alcohol and vomited 3 days ago. She is sexually active, does not always use protection and has not taken a home pregnancy test. Pt has a Mirena IUD with LMP 3 months ago. She reports h/o GERD during pregnancy, none since. She denies pain radiation to the back, abdominal pain, palpitations, diarrhea, chest trauma.   The history is provided by the patient. No language interpreter was used.    Past Medical History:  Diagnosis Date  . Anxiety   . Asthma   . Depression     Patient Active Problem List   Diagnosis Date Noted  . Pneumomediastinum (HCC) 03/09/2017    Past Surgical History:  Procedure Laterality Date  . NO PAST SURGERIES      OB History    No data available       Home Medications    Prior to Admission medications   Medication Sig Start Date End Date Taking? Authorizing Provider  albuterol (PROVENTIL HFA;VENTOLIN HFA)  108 (90 Base) MCG/ACT inhaler Inhale 3 puffs into the lungs every 4 (four) hours as needed for wheezing or shortness of breath.    [provider]  ibuprofen (ADVIL,MOTRIN) 200 MG tablet Take 600 mg by mouth every 6 (six) hours as needed for headache or moderate pain.    [provider]  Melatonin 5 MG CAPS Take 5 mg by mouth at bedtime as needed (for sleep).    [provider]    Family History No family history on file.  Social History Social History  Substance Use Topics  . Smoking status: Current Some Day Smoker    Types: E-cigarettes  . Smokeless tobacco: Never Used  . Alcohol use Yes     Comment: occassional     Allergies   Patient has no known allergies.   Review of Systems Review of Systems  Constitutional: Negative for fever.  HENT: Positive for sore throat (resolved). Negative for congestion.   Respiratory: Negative for cough.   Cardiovascular: Positive for chest pain. Negative for palpitations.  Gastrointestinal: Negative for abdominal pain, constipation, diarrhea, nausea and vomiting.  Genitourinary: Negative for difficulty urinating.  Musculoskeletal: Negative for back pain.     Physical Exam Updated Vital Signs BP 119/76   Pulse 73   Temp 98.5 F (36.9 C) (Oral)   Resp 18   Ht 5\' 3"  (1.6 m)   Wt 51.7 kg (114 lb)   SpO2 100%   BMI 20.19 kg/m   Physical  Exam  Constitutional: She is oriented to person, place, and time. She appears well-developed and well-nourished. No distress.  Thin female NAD  HENT:  Head: Normocephalic and atraumatic.  Nose: Nose normal.  Mouth/Throat: Oropharynx is clear and moist. No oropharyngeal exudate.  Eyes: Conjunctivae and EOM are normal. Pupils are equal, round, and reactive to light.  Neck: Normal range of motion. Neck supple. No JVD present.  No cervical adenopathy No anterior neck tenderness No crepitus with palpation of anterior neck or upper chest wall  Cardiovascular: Normal rate,  regular rhythm, normal heart sounds and intact distal pulses.   No murmur heard. Pulmonary/Chest: Effort normal and breath sounds normal. No respiratory distress. She has no wheezes. She has no rales. She exhibits no tenderness.  Abdominal: Soft. Bowel sounds are normal. She exhibits no distension. There is no tenderness. There is no guarding.  Musculoskeletal: Normal range of motion. She exhibits no deformity.  Lymphadenopathy:    She has no cervical adenopathy.  Neurological: She is alert and oriented to person, place, and time. No sensory deficit.  Skin: Skin is warm and dry. Capillary refill takes less than 2 seconds.  Psychiatric: She has a normal mood and affect. Her behavior is normal. Judgment and thought content normal.  Nursing note and vitals reviewed.    ED Treatments / Results   DIAGNOSTIC STUDIES: Oxygen Saturation is 99% on RA, normal by my interpretation.    COORDINATION OF CARE: 10:08 PM Discussed treatment plan with pt at bedside which includes CXR and pt agreed to plan.    Labs (all labs ordered are listed, but only abnormal results are displayed) Labs Reviewed  PREGNANCY, URINE    EKG  EKG Interpretation None       Radiology Dg Chest 2 View  Result Date: 05/30/2017 CLINICAL DATA:  21 year old with acute onset of shortness of breath and chest heaviness which began yesterday. Current smoker. EXAM: CHEST  2 VIEW COMPARISON:  03/09/2017, 03/08/2017 and earlier, including CT chest 03/08/2017 and 08/11/2014. FINDINGS: Cardiomediastinal silhouette unremarkable, unchanged. Lungs clear. Bronchovascular markings normal. Pulmonary vascularity normal. No visible pleural effusions. No pneumothorax. Visualized bony thorax intact. Bilateral nipple piercings. IMPRESSION: No acute cardiopulmonary disease. Electronically Signed   By: Hulan Saashomas  Lawrence M.D.   On: 05/30/2017 19:50    Procedures Procedures (including critical care time)  Medications Ordered in  ED Medications - No data to display   Initial Impression / Assessment and Plan / ED Course  I have reviewed the triage vital signs and the nursing notes.  Pertinent labs & imaging results that were available during my care of the patient were reviewed by me and considered in my medical decision making (see chart for details).    21 yo female with h/o pneumomediastinum 02/2017 presents to ED with pain in throat, chest discomfort, "bubble wrap" feeling with taking deep breaths x 2 day. Exam reassuring. Will get imaging. Concerned for pneumomediastinum again.   CXR reassuring, discussed cxr results with patient. Given history, red flag symptoms, I offered CT scans but patient left AMA. Boyfriend in car accident.   Final Clinical Impressions(s) / ED Diagnoses   Final diagnoses:  Left against medical advice    New Prescriptions Discharge Medication List as of 05/30/2017 10:42 PM    I personally performed the services described in this documentation, which was scribed in my presence. The recorded information has been reviewed and is accurate.    Liberty HandyGibbons, Lavoy Bernards J, PA-C 05/31/17 0008    Vanetta MuldersZackowski, Scott, MD 06/09/17  0017  

## 2017-05-30 NOTE — ED Triage Notes (Signed)
Sob x 2 days. 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 She is speaking in complete sentences and in no distress at this time.

## 2017-05-30 NOTE — ED Notes (Signed)
Pt is having to leave before tx is finished due to family emergency PA notified. Pt was encouraged to remain and complete care. Pt was given the risks and agreed to return to the ER in the am to complete care.

## 2017-08-22 ENCOUNTER — Encounter (HOSPITAL_COMMUNITY): Payer: Self-pay | Admitting: *Deleted

## 2017-08-22 ENCOUNTER — Emergency Department (HOSPITAL_COMMUNITY): Payer: Self-pay

## 2017-08-22 DIAGNOSIS — M549 Dorsalgia, unspecified: Secondary | ICD-10-CM | POA: Insufficient documentation

## 2017-08-22 DIAGNOSIS — J45909 Unspecified asthma, uncomplicated: Secondary | ICD-10-CM | POA: Insufficient documentation

## 2017-08-22 DIAGNOSIS — R000 Tachycardia, unspecified: Secondary | ICD-10-CM | POA: Insufficient documentation

## 2017-08-22 DIAGNOSIS — Z79899 Other long term (current) drug therapy: Secondary | ICD-10-CM | POA: Insufficient documentation

## 2017-08-22 DIAGNOSIS — R0789 Other chest pain: Secondary | ICD-10-CM | POA: Insufficient documentation

## 2017-08-22 DIAGNOSIS — R509 Fever, unspecified: Secondary | ICD-10-CM | POA: Insufficient documentation

## 2017-08-22 DIAGNOSIS — F17290 Nicotine dependence, other tobacco product, uncomplicated: Secondary | ICD-10-CM | POA: Insufficient documentation

## 2017-08-22 DIAGNOSIS — R1010 Upper abdominal pain, unspecified: Secondary | ICD-10-CM | POA: Insufficient documentation

## 2017-08-22 LAB — BASIC METABOLIC PANEL
ANION GAP: 9 (ref 5–15)
BUN: 9 mg/dL (ref 6–20)
CALCIUM: 9.3 mg/dL (ref 8.9–10.3)
CO2: 23 mmol/L (ref 22–32)
Chloride: 102 mmol/L (ref 101–111)
Creatinine, Ser: 0.84 mg/dL (ref 0.44–1.00)
GFR calc Af Amer: >60 mL/min (ref >60)
GFR calc non Af Amer: >60 mL/min (ref >60)
GLUCOSE: 88 mg/dL (ref 65–99)
Potassium: 3.1 mmol/L — ABNORMAL LOW (ref 3.5–5.1)
Sodium: 134 mmol/L — ABNORMAL LOW (ref 135–145)

## 2017-08-22 LAB — I-STAT TROPONIN, ED: TROPONIN I, POC: 0.01 ng/mL (ref 0.00–0.08)

## 2017-08-22 LAB — CBC
HCT: 42 % (ref 36.0–46.0)
HEMOGLOBIN: 14.5 g/dL (ref 12.0–15.0)
MCH: 30.8 pg (ref 26.0–34.0)
MCHC: 34.5 g/dL (ref 30.0–36.0)
MCV: 89.2 fL (ref 78.0–100.0)
Platelets: 174 10*3/uL (ref 150–400)
RBC: 4.71 MIL/uL (ref 3.87–5.11)
RDW: 11.7 % (ref 11.5–15.5)
WBC: 8.5 10*3/uL (ref 4.0–10.5)

## 2017-08-22 NOTE — ED Triage Notes (Addendum)
Pt c/o sob onset at 5pm while at her daughter's gymnastics class. Pt reports being told she had a tear in her esophagus about 6months ago. Pt able to speak in short sentences, appears to have difficulty taking deep breaths    Per chart, pt diagnosed with pneumomediastinum in March

## 2017-08-23 ENCOUNTER — Emergency Department (HOSPITAL_COMMUNITY)
Admission: EM | Admit: 2017-08-23 | Discharge: 2017-08-23 | Disposition: A | Discharge status: Home / Self Care | Payer: Self-pay | Attending: Emergency Medicine | Admitting: Emergency Medicine

## 2017-08-23 DIAGNOSIS — R0789 Other chest pain: Secondary | ICD-10-CM

## 2017-08-23 DIAGNOSIS — R509 Fever, unspecified: Secondary | ICD-10-CM

## 2017-08-23 LAB — PREGNANCY, URINE: Preg Test, Ur: NEGATIVE

## 2017-08-23 LAB — LIPASE, BLOOD: LIPASE: 38 U/L (ref 11–51)

## 2017-08-23 LAB — URINALYSIS, ROUTINE W REFLEX MICROSCOPIC
BILIRUBIN URINE: NEGATIVE
Glucose, UA: NEGATIVE mg/dL
HGB URINE DIPSTICK: NEGATIVE
Ketones, ur: NEGATIVE mg/dL
Nitrite: NEGATIVE
Protein, ur: 30 mg/dL — AB
SPECIFIC GRAVITY, URINE: 1.021 (ref 1.005–1.030)
pH: 7 (ref 5.0–8.0)

## 2017-08-23 LAB — HEPATIC FUNCTION PANEL
ALK PHOS: 39 U/L (ref 38–126)
ALT: 11 U/L — ABNORMAL LOW (ref 14–54)
AST: 23 U/L (ref 15–41)
Albumin: 4.2 g/dL (ref 3.5–5.0)
BILIRUBIN INDIRECT: 0.2 mg/dL — AB (ref 0.3–0.9)
BILIRUBIN TOTAL: 0.3 mg/dL (ref 0.3–1.2)
Bilirubin, Direct: 0.1 mg/dL (ref 0.1–0.5)
TOTAL PROTEIN: 7.1 g/dL (ref 6.5–8.1)

## 2017-08-23 LAB — D-DIMER, QUANTITATIVE (NOT AT ARMC): D DIMER QUANT: 0.27 ug{FEU}/mL (ref 0.00–0.50)

## 2017-08-23 LAB — POC URINE PREG, ED
Preg Test, Ur: NEGATIVE
Preg Test, Ur: NEGATIVE

## 2017-08-23 MED ORDER — FENTANYL CITRATE (PF) 100 MCG/2ML IJ SOLN
50.0000 ug | Freq: Once | INTRAMUSCULAR | Status: AC
  Administered 2017-08-23: 50 ug via INTRAVENOUS
  Filled 2017-08-23: qty 2

## 2017-08-23 MED ORDER — ACETAMINOPHEN 500 MG PO TABS
1000.0000 mg | ORAL_TABLET | Freq: Once | ORAL | Status: AC
  Administered 2017-08-23: 1000 mg via ORAL
  Filled 2017-08-23: qty 2

## 2017-08-23 MED ORDER — KETOROLAC TROMETHAMINE 30 MG/ML IJ SOLN
30.0000 mg | Freq: Once | INTRAMUSCULAR | Status: AC
  Administered 2017-08-23: 30 mg via INTRAVENOUS
  Filled 2017-08-23: qty 1

## 2017-08-23 MED ORDER — IBUPROFEN 800 MG PO TABS: 800.0000 mg | ORAL_TABLET | Freq: Three times a day (TID) | ORAL | 0 refills | Status: AC | PRN

## 2017-08-23 MED ORDER — SODIUM CHLORIDE 0.9 % IV BOLUS (SEPSIS)
1000.0000 mL | Freq: Once | INTRAVENOUS | Status: AC
  Administered 2017-08-23: 1000 mL via INTRAVENOUS

## 2017-08-23 NOTE — ED Provider Notes (Signed)
TIME SEEN: 4:27 AM  CHIEF COMPLAINT: Chest pain, fever  HPI: Patient is a 21 year old female with history of previous pneumomediastinum in March who is a smoker who presents emergency department with diffuse chest pain, back pain and upper abdominal pain that feels like her previous pneumomediastinum that started 24 hours ago. She has also had fever and chills today. She has had some sore throat but no difficulty swallowing, speaking. She denies any ear pain, headache, neck pain or neck stiffness, cough, vomiting or diarrhea, dysuria hematuria, rash. No sick contacts or recent travel. No tick bites. She is unable to describe the pain but states it is worse with deep inspiration. No history of PE or DVT.  ROS: See HPI Constitutional:  fever  Eyes: no drainage ENT: no runny nose   Cardiovascular:   chest pain  Resp: SOB  GI: no vomiting GU: no dysuria Integumentary: no rash  Allergy: no hives  Musculoskeletal: no leg swelling  Neurological: no slurred speech ROS otherwise negative  PAST MEDICAL HISTORY/PAST SURGICAL HISTORY:  Past Medical History:  Diagnosis Date  . Anxiety   . Asthma   . Depression     MEDICATIONS:  Prior to Admission medications   Medication Sig Start Date End Date Taking? Authorizing Provider  acetaminophen (TYLENOL) 500 MG tablet Take 1,000 mg by mouth every 6 (six) hours as needed for headache.   Yes [provider]    ALLERGIES:  Allergies  Allergen Reactions  . Banana Anaphylaxis    SOCIAL HISTORY:  Social History  Substance Use Topics  . Smoking status: Current Some Day Smoker    Types: E-cigarettes  . Smokeless tobacco: Never Used  . Alcohol use Yes     Comment: occassional    FAMILY HISTORY: No family history on file.  EXAM: BP 110/76   Pulse 98   Temp (!) 101.5 F (38.6 C) (Oral)   Resp 15   SpO2 98%  CONSTITUTIONAL: Alert and oriented and responds appropriately to questions. Well-appearing; well-nourished HEAD:  Normocephalic EYES: Conjunctivae clear, pupils appear equal, EOMI ENT: normal nose; moist mucous membranes; No pharyngeal erythema or petechiae, no tonsillar hypertrophy or exudate, no uvular deviation, no unilateral swelling, no trismus or drooling, no muffled voice, normal phonation, no stridor, no dental caries present, no drainable dental abscess noted, no Ludwig's angina, tongue sits flat in the bottom of the mouth, no angioedema, no facial erythema or warmth, no facial swelling; no pain with movement of the neck. NECK: Supple, no meningismus, no nuchal rigidity, no LAD  CARD: Regular and tachycardic; S1 and S2 appreciated; no murmurs, no clicks, no rubs, no gallops RESP: Normal chest excursion without splinting or tachypnea; breath sounds clear and equal bilaterally; no wheezes, no rhonchi, no rales, no hypoxia or respiratory distress, speaking full sentences ABD/GI: Normal bowel sounds; non-distended; soft, non-tender, no rebound, no guarding, no peritoneal signs, no hepatosplenomegaly BACK:  The back appears normal and is non-tender to palpation, there is no CVA tenderness EXT: Normal ROM in all joints; non-tender to palpation; no edema; normal capillary refill; no cyanosis, no calf tenderness or swelling    SKIN: Normal color for age and race; warm; no rash NEURO: Moves all extremities equally PSYCH: The patient's mood and manner are appropriate. Grooming and personal hygiene are appropriate.  MEDICAL DECISION MAKING: Patient here with atypical chest pain. Chest x-ray shows no sign of pneumomediastinum, infiltrate, edema or pleural effusion. Her vital signs are normal except for mild tachycardia which is likely secondary to  fever. We'll treat her symptoms with IV fluids, Toradol and a dose of Tylenol. Labs have been unremarkable including a negative troponin. While in a d-dimer, LFTs, lipase given complaints of upper abdominal pain and pleuritic chest pain as well as urine and urine pregnancy  test.  ED PROGRESS: Patient reports feeling better after Toradol and fentanyl. Pregnancy test negative. Urine shows red blood cells and white blood cells but rare bacteria and many squamous cells. Suspect dirty catch. She is not having urinary symptoms. Her d-dimer is negative. Suspect viral illness causing her symptoms. There is no sign of any bacterial infection at this time. She is nontoxic. I feel she is safe for discharge home. Recommended alternating Tylenol and Motrin at home. She is comfortable with this plan. She is no history of IV drug abuse. Doubt endocarditis. Nothing at this time to suggest myocarditis. No EKG changes or changes With her pain with changes in position To suggest pericarditis at this time.   At this time, I do not feel there is any life-threatening condition present. I have reviewed and discussed all results (EKG, imaging, lab, urine as appropriate) and exam findings with patient/family. I have reviewed nursing notes and appropriate previous records.  I feel the patient is safe to be discharged home without further emergent workup and can continue workup as an outpatient as needed. Discussed usual and customary return precautions. Patient/family verbalize understanding and are comfortable with this plan.  Outpatient follow-up has been provided if needed. All questions have been answered.   EKG Interpretation  Date/Time:  Monday August 22 2017 22:15:27 EDT Ventricular Rate:  104 PR Interval:  124 QRS Duration: 92 QT Interval:  332 QTC Calculation: 436 R Axis:   76 Text Interpretation:  Sinus tachycardia Nonspecific T wave abnormality Abnormal ECG No old tracing to compare Confirmed by Hameed Kolar, Baxter HireKristen [id number]) on 08/23/2017 3:38:35 AM         [facility] [employer], Layla MawKristen N, DO 08/23/17 [id number]

## 2017-08-23 NOTE — ED Notes (Signed)
Pt verbalized understanding of d/c instructions and has no further questions. Pt is stable, A&Ox4, VSS.  

## 2017-08-23 NOTE — Discharge Instructions (Signed)
You may alternate Tylenol 1000 mg every 6 hours as needed for pain and Ibuprofen 800 mg every 8 hours as needed for pain.  Please take Ibuprofen with food. ° ° ° °To find a primary care or specialty doctor please call [phone] or [phone] to access "[facility] Find a Doctor Service." ° °You may also go on the [facility] website at [email or web address] ° °There are also multiple [facility], [facility] practices throughout the [place] that are frequently accepting new patients. You may find a clinic that is close to your home and contact them. ° °[facility] -  °[address] °[place] [address] °[phone] ° ° °[facility] -  °[address] °[place] [address] °[phone] ° ° °[facility] - °[address]  °[place] [address] °[phone] ° ° °

## 2017-12-13 IMAGING — DX DG CHEST 2V
2 series · 2 of 2 positions shown · non-contrast
Comparison: 03/09/2017, 03/08/2017 and earlier, including CT chest
03/08/2017 and 08/11/2014.

CLINICAL DATA: 21-year-old with acute onset of shortness of breath
and chest heaviness which began yesterday. Current smoker.

EXAM:
CHEST  2 VIEW

[chest pa]
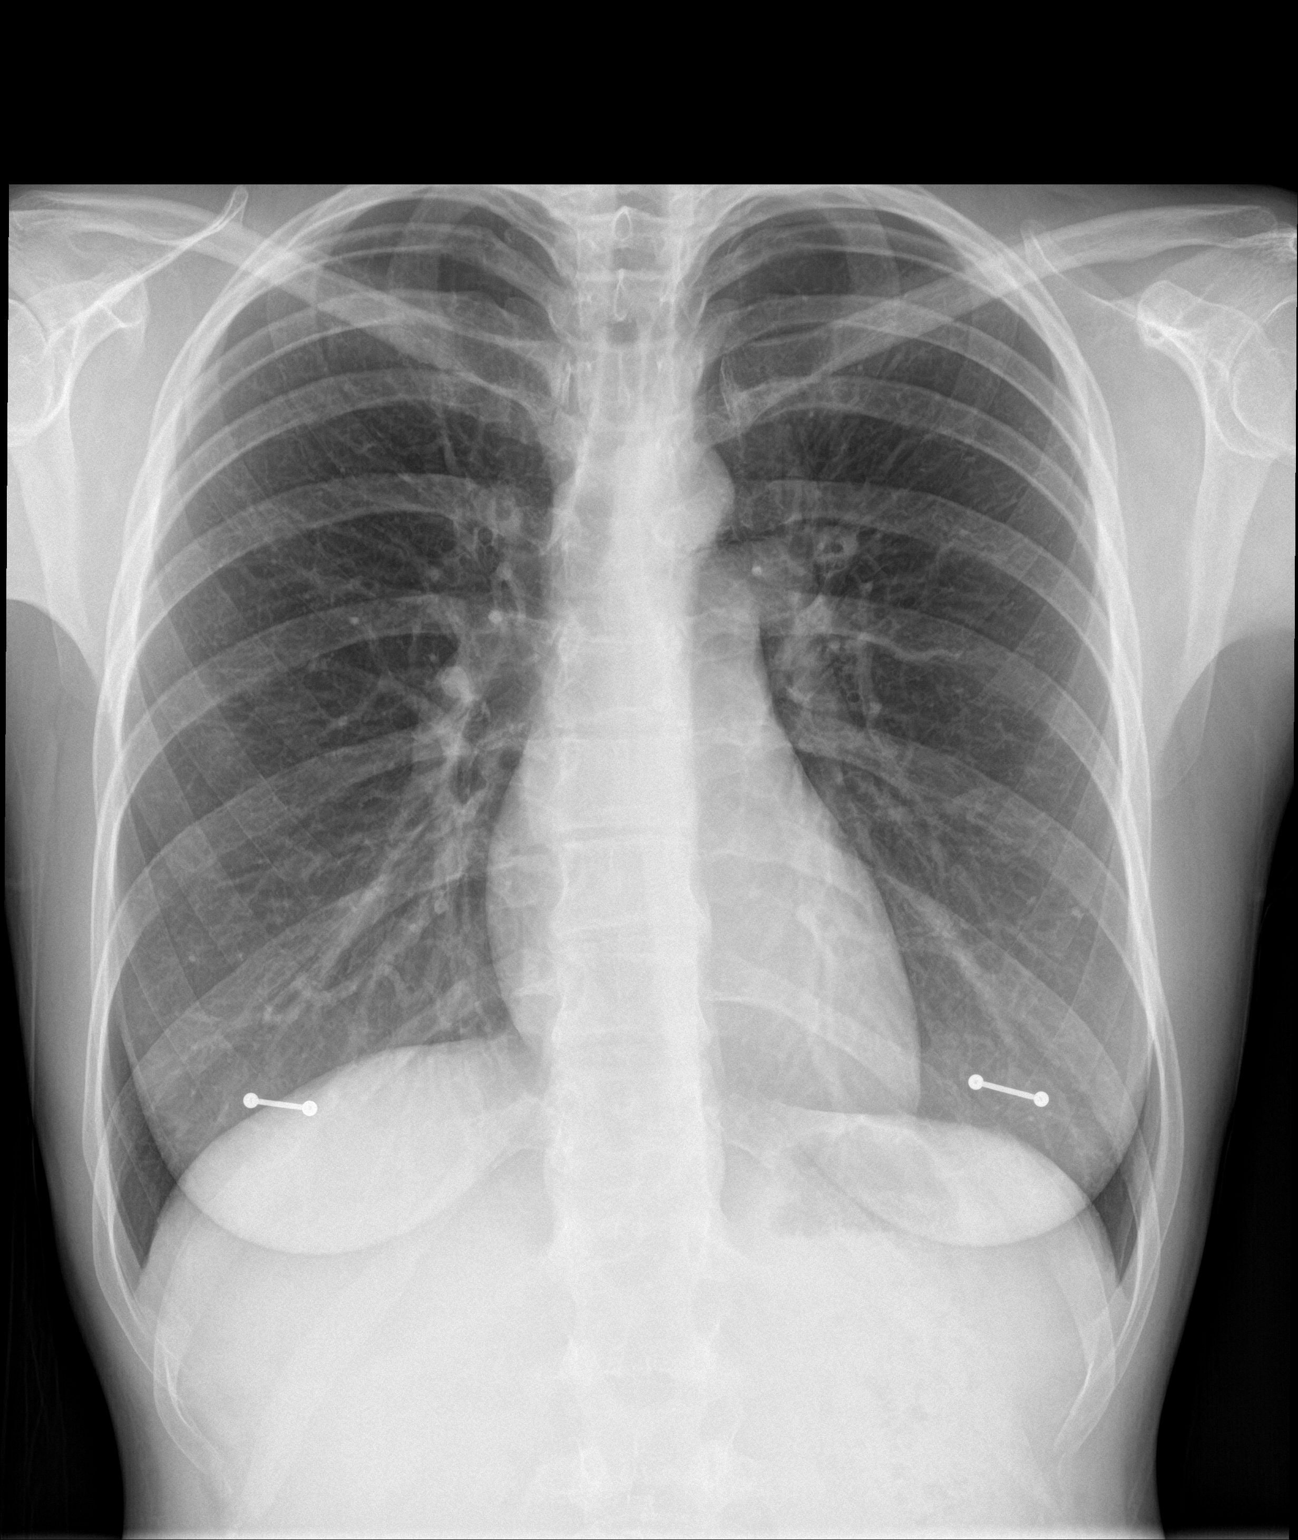

[chest lat]
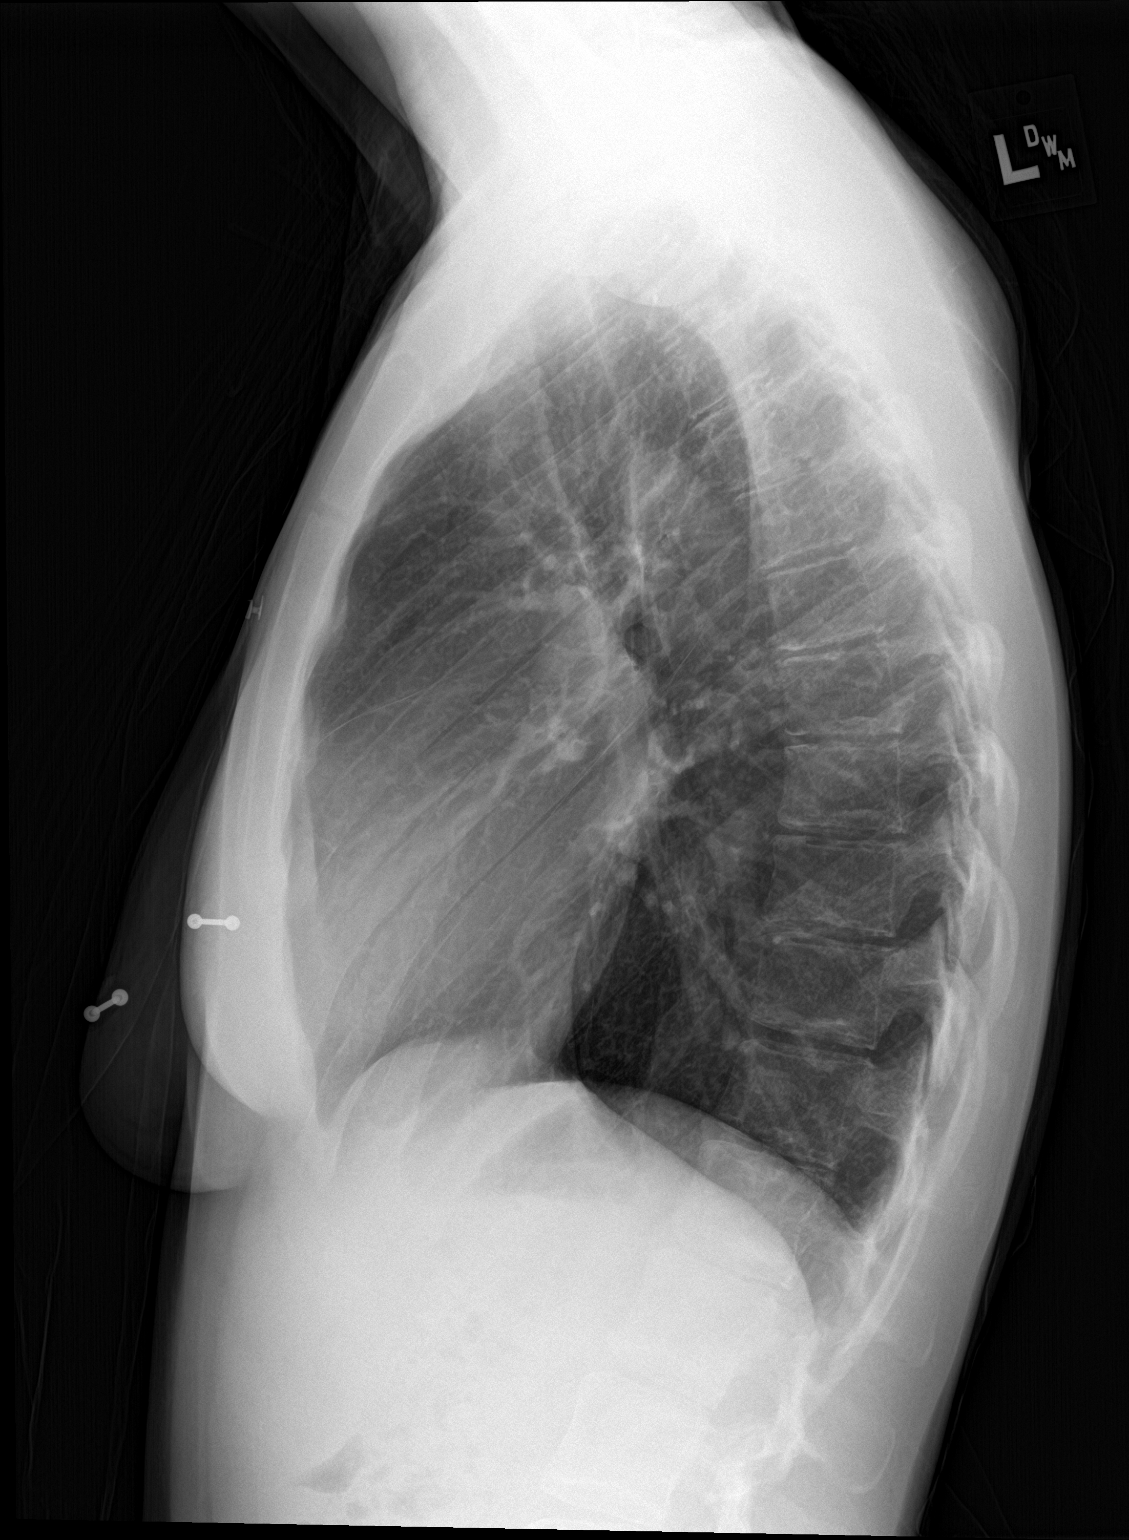

[2 of 2 positions shown; findings below may reference images not displayed]

FINDINGS: Cardiomediastinal silhouette unremarkable, unchanged. Lungs clear.
Bronchovascular markings normal. Pulmonary vascularity normal. No
visible pleural effusions. No pneumothorax. Visualized bony thorax
intact. Bilateral nipple piercings.
IMPRESSION: No acute cardiopulmonary disease.

## 2018-03-07 IMAGING — DX DG CHEST 2V
2 series · 2 of 2 positions shown · non-contrast
Comparison: 05/30/2017

CLINICAL DATA: Patient complains of shortness of breath onset at
5pm while at her daughter's gymnastics class. Page reports being
told she had a tear in her esophagus about 2months ago. Patient able
to speak in short sentences, appears to have difficulty taking deep
breaths

EXAM:
CHEST  2 VIEW

[w chest pa]
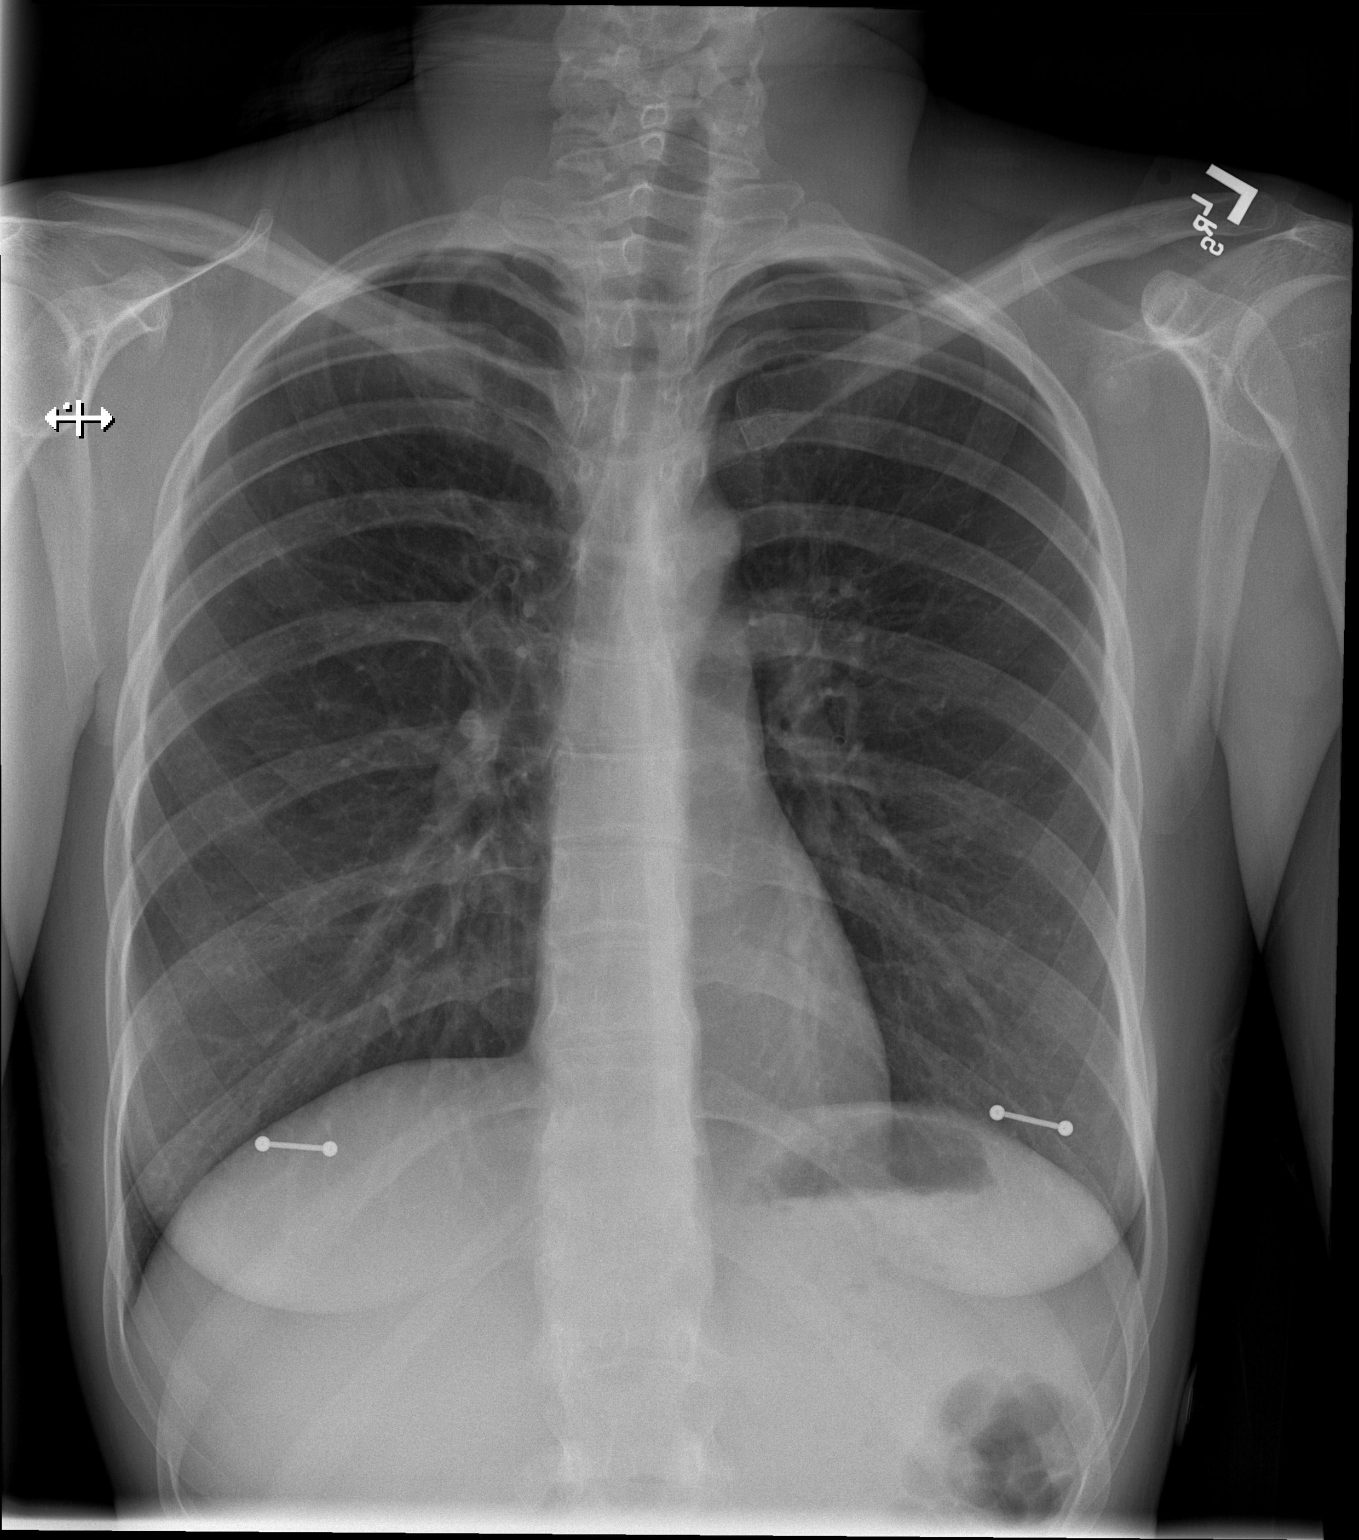

[w chest lat]
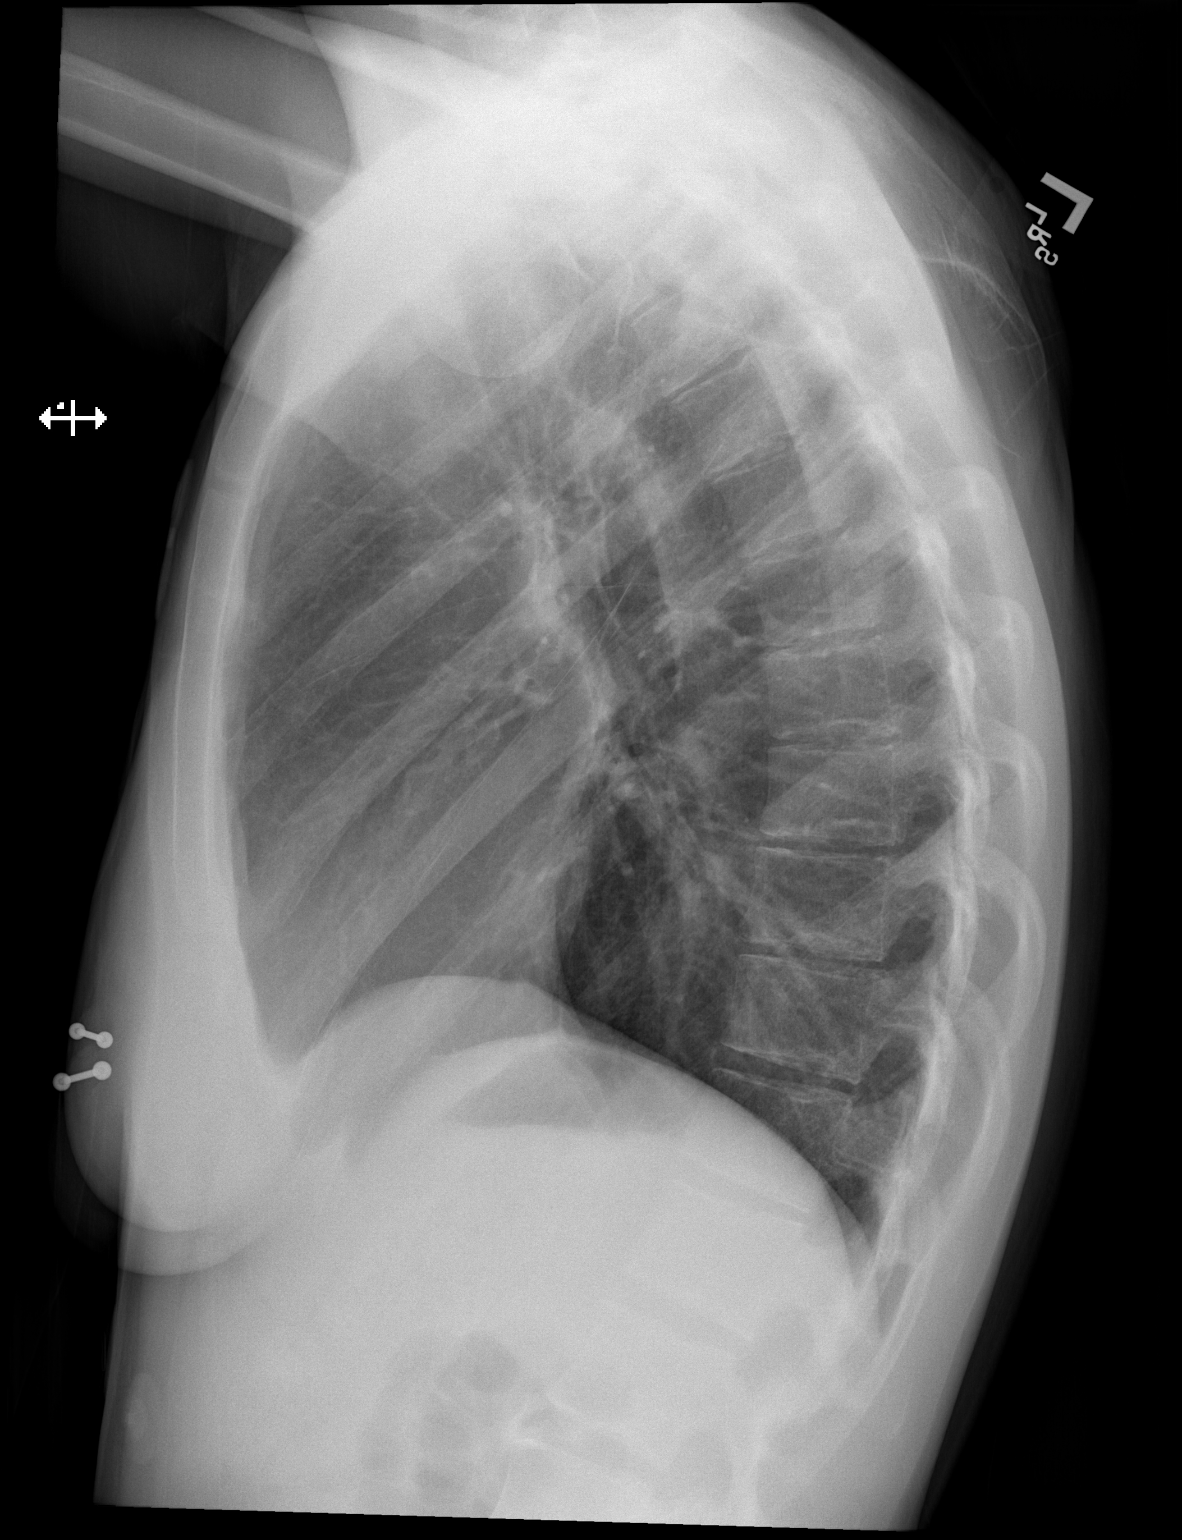

[2 of 2 positions shown; findings below may reference images not displayed]

FINDINGS: The heart size and mediastinal contours are within normal limits.
Both lungs are clear. The visualized skeletal structures are
unremarkable.
IMPRESSION: No active cardiopulmonary disease.

## 2018-08-16 ENCOUNTER — Other Ambulatory Visit: Payer: Self-pay

## 2018-08-16 ENCOUNTER — Encounter (HOSPITAL_COMMUNITY): Payer: Self-pay | Admitting: Emergency Medicine

## 2018-08-16 ENCOUNTER — Emergency Department (HOSPITAL_COMMUNITY)
Admission: EM | Admit: 2018-08-16 | Discharge: 2018-08-17 | Disposition: A | Discharge status: Home / Self Care | Payer: Self-pay | Attending: Emergency Medicine | Admitting: Emergency Medicine

## 2018-08-16 DIAGNOSIS — R55 Syncope and collapse: Secondary | ICD-10-CM | POA: Insufficient documentation

## 2018-08-16 DIAGNOSIS — F419 Anxiety disorder, unspecified: Secondary | ICD-10-CM | POA: Insufficient documentation

## 2018-08-16 DIAGNOSIS — R202 Paresthesia of skin: Secondary | ICD-10-CM | POA: Insufficient documentation

## 2018-08-16 DIAGNOSIS — F17210 Nicotine dependence, cigarettes, uncomplicated: Secondary | ICD-10-CM | POA: Insufficient documentation

## 2018-08-16 DIAGNOSIS — J45909 Unspecified asthma, uncomplicated: Secondary | ICD-10-CM | POA: Insufficient documentation

## 2018-08-16 DIAGNOSIS — F329 Major depressive disorder, single episode, unspecified: Secondary | ICD-10-CM | POA: Insufficient documentation

## 2018-08-16 LAB — URINALYSIS, ROUTINE W REFLEX MICROSCOPIC
BILIRUBIN URINE: NEGATIVE
Glucose, UA: NEGATIVE mg/dL
HGB URINE DIPSTICK: NEGATIVE
KETONES UR: NEGATIVE mg/dL
Nitrite: NEGATIVE
Protein, ur: NEGATIVE mg/dL
Specific Gravity, Urine: 1.02 (ref 1.005–1.030)
pH: 6 (ref 5.0–8.0)

## 2018-08-16 LAB — CBC
HCT: 41.3 % (ref 36.0–46.0)
Hemoglobin: 13.5 g/dL (ref 12.0–15.0)
MCH: 31 pg (ref 26.0–34.0)
MCHC: 32.7 g/dL (ref 30.0–36.0)
MCV: 94.7 fL (ref 78.0–100.0)
Platelets: 153 10*3/uL (ref 150–400)
RBC: 4.36 MIL/uL (ref 3.87–5.11)
RDW: 11.7 % (ref 11.5–15.5)
WBC: 5.8 10*3/uL (ref 4.0–10.5)

## 2018-08-16 LAB — BASIC METABOLIC PANEL
Anion gap: 10 (ref 5–15)
BUN: 9 mg/dL (ref 6–20)
CO2: 25 mmol/L (ref 22–32)
Calcium: 9.2 mg/dL (ref 8.9–10.3)
Chloride: 106 mmol/L (ref 98–111)
Creatinine, Ser: 0.87 mg/dL (ref 0.44–1.00)
GFR calc Af Amer: >60 mL/min (ref >60)
GFR calc non Af Amer: >60 mL/min (ref >60)
Glucose, Bld: 122 mg/dL — ABNORMAL HIGH (ref 70–99)
POTASSIUM: 3.5 mmol/L (ref 3.5–5.1)
Sodium: 141 mmol/L (ref 135–145)

## 2018-08-16 LAB — I-STAT BETA HCG BLOOD, ED (MC, WL, AP ONLY)

## 2018-08-16 NOTE — ED Triage Notes (Signed)
Pt reports a syncopal episode at 2230 tonight with LOC for about a minute and a half. Pt fell and hit her head on the ground, boyfriend was able to break some of her fall. Pt reports numbness to tips of fingers on both hands. Denies pain.

## 2018-08-17 LAB — CBG MONITORING, ED: GLUCOSE-CAPILLARY: 93 mg/dL (ref 70–99)

## 2018-08-17 NOTE — ED Notes (Signed)
Pt reports being outside this evening and passing out at about 2230 hrs. Pt denies any dizziness or weakness prior to same.

## 2018-08-17 NOTE — Discharge Instructions (Addendum)
Hydrate yourself this week

## 2018-08-17 NOTE — ED Notes (Signed)
Patient verbalizes understanding of discharge instructions. Opportunity for questioning and answers were provided. Armband removed by staff, pt discharged from ED to home via POV  

## 2018-08-17 NOTE — ED Provider Notes (Signed)
[facility] EMERGENCY DEPARTMENT Provider Note   CSN: [id number] Arrival date & time: 08/16/18  2249     History   Chief Complaint Chief Complaint  Patient presents with  . Loss of Consciousness    HPI Paige Sims is a 22 y.o. female.  HPI 22 year old female with no significant past medical history presents to the emergency department after a syncopal episode while at a funeral today.  She states that she has been somewhat lightheaded with standing over the past several days.  Denies nausea vomiting.  No diarrhea.  Reports her appetite is been poor over the past several days.  She drank 2 glasses of water today.  She did drink a beer before the event.  She was standing for prolonged period time when the syncopal episode occurred.  No preceding chest pain or palpitations.  Currently asymptomatic.   Past Medical History:  Diagnosis Date  . Anxiety   . Asthma   . Depression     Patient Active Problem List   Diagnosis Date Noted  . Pneumomediastinum (HCC) 03/09/2017    Past Surgical History:  Procedure Laterality Date  . NO PAST SURGERIES       OB History   None      Home Medications    Prior to Admission medications   Medication Sig Start Date End Date Taking? Authorizing Provider  acetaminophen (TYLENOL) 500 MG tablet Take 1,000 mg by mouth every 6 (six) hours as needed for headache.    [provider]  ibuprofen (ADVIL,MOTRIN) 800 MG tablet Take 1 tablet (800 mg total) by mouth every 8 (eight) hours as needed for mild pain. 08/23/17   Ward, Layla Maw, DO    Family History No family history on file.  Social History Social History   Tobacco Use  . Smoking status: Current Some Day Smoker    Types: E-cigarettes, Cigarettes  . Smokeless tobacco: Never Used  Substance Use Topics  . Alcohol use: Yes    Comment: occassional  . Drug use: No     Allergies   Banana   Review of Systems Review of Systems  All other systems  reviewed and are negative.    Physical Exam Updated Vital Signs BP 123/74 (BP Location: Right Arm)   Pulse 79   Temp 98.6 F (37 C) (Oral)   Resp 16   Ht 5\' 4"  (1.626 m)   Wt 49.9 kg   LMP 08/02/2018   SpO2 100%   BMI 18.88 kg/m   Physical Exam  Constitutional: She is oriented to person, place, and time. She appears well-developed and well-nourished. No distress.  HENT:  Head: Normocephalic and atraumatic.  Eyes: EOM are normal.  Neck: Normal range of motion.  Cardiovascular: Normal rate, regular rhythm and normal heart sounds.  Pulmonary/Chest: Effort normal and breath sounds normal.  Abdominal: Soft. She exhibits no distension. There is no tenderness.  Musculoskeletal: Normal range of motion.  Neurological: She is alert and oriented to person, place, and time.  Skin: Skin is warm and dry.  Psychiatric: She has a normal mood and affect. Judgment normal.  Nursing note and vitals reviewed.    ED Treatments / Results  Labs (all labs ordered are listed, but only abnormal results are displayed) Labs Reviewed  BASIC METABOLIC PANEL - Abnormal; Notable for the following components:      Result Value   Glucose, Bld 122 (*)    All other components within normal limits  URINALYSIS, ROUTINE W REFLEX  MICROSCOPIC - Abnormal; Notable for the following components:   APPearance CLOUDY (*)    Leukocytes, UA LARGE (*)    Bacteria, UA FEW (*)    All other components within normal limits  URINE CULTURE  CBC  CBG MONITORING, ED  I-STAT BETA HCG BLOOD, ED ([facility], [facility], [facility] ONLY)    EKG EKG Interpretation  Date/Time:  Wednesday August 16 2018 22:55:34 EDT Ventricular Rate:  81 PR Interval:  120 QRS Duration: 94 QT Interval:  380 QTC Calculation: 441 R Axis:   74 Text Interpretation:  Normal sinus rhythm Normal ECG No significant change was found Confirmed by Azalia Bilis ([id number]) on 08/17/2018 12:58:59 AM   Radiology No results found.  Procedures Procedures (including  critical care time)  Medications Ordered in ED Medications - No data to display   Initial Impression / Assessment and Plan / ED Course  I have reviewed the triage vital signs and the nursing notes.  Pertinent labs & imaging results that were available during my care of the patient were reviewed by me and considered in my medical decision making (see chart for details).     Labs, EKG, pregnancy tests all without abnormality.  Ambulatory in the ER without difficulty.  No lightheadedness.  Suspect mild volume depletion with likely orthostatic hypotension from prolonged standing.  Final Clinical Impressions(s) / ED Diagnoses   Final diagnoses:  Syncope, unspecified syncope type    ED Discharge Orders    None       Azalia Bilis, MD 08/17/18 [phone]

## 2018-08-19 LAB — URINE CULTURE
